# Patient Record
Sex: Male | Born: 1950 | Race: White | Hispanic: No | Marital: Married | Smoking: Former smoker
Health system: Southern US, Community
[De-identification: ages and names within clinical notes are randomized; demographics above are authoritative.]

## PROBLEM LIST (undated history)

## (undated) DIAGNOSIS — R519 Headache, unspecified: Secondary | ICD-10-CM

## (undated) DIAGNOSIS — J449 Chronic obstructive pulmonary disease, unspecified: Secondary | ICD-10-CM

## (undated) DIAGNOSIS — M1990 Unspecified osteoarthritis, unspecified site: Secondary | ICD-10-CM

## (undated) DIAGNOSIS — I10 Essential (primary) hypertension: Secondary | ICD-10-CM

## (undated) DIAGNOSIS — I8290 Acute embolism and thrombosis of unspecified vein: Secondary | ICD-10-CM

## (undated) DIAGNOSIS — G4730 Sleep apnea, unspecified: Secondary | ICD-10-CM

## (undated) DIAGNOSIS — E119 Type 2 diabetes mellitus without complications: Secondary | ICD-10-CM

## (undated) HISTORY — PX: HAND SURGERY: SHX662

## (undated) HISTORY — PX: SHOULDER ARTHROSCOPY W/ ROTATOR CUFF REPAIR: SHX2400

## (undated) HISTORY — PX: HERNIA REPAIR: SHX51

## (undated) HISTORY — PX: TOTAL SHOULDER ARTHROPLASTY: SHX126

---

## 2004-09-24 ENCOUNTER — Ambulatory Visit: Admission: RE | Admit: 2004-09-24 | Discharge: 2004-09-24 | Payer: Self-pay | Admitting: Orthopedic Surgery

## 2005-02-07 ENCOUNTER — Ambulatory Visit (HOSPITAL_COMMUNITY): Admission: RE | Admit: 2005-02-07 | Discharge: 2005-02-08 | Payer: Self-pay | Admitting: Orthopedic Surgery

## 2006-01-12 ENCOUNTER — Ambulatory Visit (HOSPITAL_COMMUNITY): Admission: RE | Admit: 2006-01-12 | Discharge: 2006-01-12 | Payer: Self-pay | Admitting: Orthopedic Surgery

## 2020-05-21 ENCOUNTER — Other Ambulatory Visit: Payer: Self-pay | Admitting: Orthopaedic Surgery

## 2020-05-21 DIAGNOSIS — Z01818 Encounter for other preprocedural examination: Secondary | ICD-10-CM

## 2020-06-02 NOTE — Patient Instructions (Signed)
DUE TO COVID-19 ONLY ONE VISITOR IS ALLOWED TO COME WITH YOU AND STAY IN THE WAITING ROOM ONLY DURING PRE OP AND PROCEDURE DAY OF SURGERY. THE 1 VISITOR  MAY VISIT WITH YOU AFTER SURGERY IN YOUR PRIVATE ROOM DURING VISITING HOURS ONLY!  YOU NEED TO HAVE A COVID 19 TEST ON: 06/12/20 @          , THIS TEST MUST BE DONE BEFORE SURGERY,  COVID TESTING SITE [address] [place] [address], IT IS ON THE RIGHT GOING OUT [address] APPROXIMATELY  2 MINUTES PAST [employer] ON THE RIGHT. ONCE YOUR COVID TEST IS COMPLETED,  PLEASE BEGIN THE QUARANTINE INSTRUCTIONS AS OUTLINED IN YOUR HANDOUT.                Richie Bonanno    Your procedure is scheduled on: 06/16/20   Report to [facility] Main  Entrance   Report to short stay at: 5:15 AM     Call this number if you have problems the morning of surgery [phone]    Remember:  NO SOLID FOOD AFTER MIDNIGHT THE NIGHT PRIOR TO SURGERY. NOTHING BY MOUTH EXCEPT CLEAR LIQUIDS UNTIL: 4:30 AM . PLEASE FINISH ENSURE DRINK PER SURGEON ORDER  WHICH NEEDS TO BE COMPLETED AT: 4:30 AM .  CLEAR LIQUID DIET  Foods Allowed                                                                     Foods Excluded  Coffee and tea, regular and decaf                             liquids that you cannot  Plain Jell-O any favor except red or purple                                           see through such as: Fruit ices (not with fruit pulp)                                     milk, soups, orange juice  Iced Popsicles                                    All solid food Carbonated beverages, regular and diet                                    Cranberry, grape and apple juices Sports drinks like Gatorade Lightly seasoned clear broth or consume(fat free) Sugar, honey syrup  Sample Menu Breakfast                                Lunch  Supper Cranberry juice                    Beef broth                             Chicken broth Jell-O                                     Grape juice                           Apple juice Coffee or tea                        Jell-O                                      Popsicle                                                Coffee or tea                        Coffee or tea  _____________________________________________________________________   BRUSH YOUR TEETH MORNING OF SURGERY AND RINSE YOUR MOUTH OUT, NO CHEWING GUM CANDY OR MINTS.    Take these medicines the morning of surgery with A SIP OF WATER: gabapentin.                               You may not have any metal on your body including hair pins and              piercings  Do not wear jewelry, lotions, powders or perfumes, deodorant             Men may shave face and neck.   Do not bring valuables to the hospital. [facility] IS NOT             RESPONSIBLE   FOR VALUABLES.  Contacts, dentures or bridgework may not be worn into surgery.  Leave suitcase in the car. After surgery it may be brought to your room.     Patients discharged the day of surgery will not be allowed to drive home. IF YOU ARE HAVING SURGERY AND GOING HOME THE SAME DAY, YOU MUST HAVE AN ADULT TO DRIVE YOU HOME AND BE WITH YOU FOR 24 HOURS. YOU MAY GO HOME BY TAXI OR UBER OR ORTHERWISE, BUT AN ADULT MUST ACCOMPANY YOU HOME AND STAY WITH YOU FOR 24 HOURS.  Name and phone number of your driver:  Special Instructions: N/A              Please read over the following fact sheets you were given: _____________________________________________________________________         [facility] - Preparing for Surgery Before surgery, you can play an important role.  Because skin is not sterile, your skin needs to be as free of germs as possible.  You can reduce the number of germs on your skin by washing with CHG (chlorahexidine gluconate)  soap before surgery.  CHG is an antiseptic cleaner which kills germs and bonds with the skin to continue killing germs  even after washing. Please DO NOT use if you have an allergy to CHG or antibacterial soaps.  If your skin becomes reddened/irritated stop using the CHG and inform your nurse when you arrive at Short Stay. Do not shave (including legs and underarms) for at least 48 hours prior to the first CHG shower.  You may shave your face/neck. Please follow these instructions carefully:  1.  Shower with CHG Soap the night before surgery and the  morning of Surgery.  2.  If you choose to wash your hair, wash your hair first as usual with your  normal  shampoo.  3.  After you shampoo, rinse your hair and body thoroughly to remove the  shampoo.                           4.  Use CHG as you would any other liquid soap.  You can apply chg directly  to the skin and wash                       Gently with a scrungie or clean washcloth.  5.  Apply the CHG Soap to your body ONLY FROM THE NECK DOWN.   Do not use on face/ open                           Wound or open sores. Avoid contact with eyes, ears mouth and genitals (private parts).                       Wash face,  Genitals (private parts) with your normal soap.             6.  Wash thoroughly, paying special attention to the area where your surgery  will be performed.  7.  Thoroughly rinse your body with warm water from the neck down.  8.  DO NOT shower/wash with your normal soap after using and rinsing off  the CHG Soap.                9.  Pat yourself dry with a clean towel.            10.  Wear clean pajamas.            11.  Place clean sheets on your bed the night of your first shower and do not  sleep with pets. Day of Surgery : Do not apply any lotions/deodorants the morning of surgery.  Please wear clean clothes to the hospital/surgery center.  FAILURE TO FOLLOW THESE INSTRUCTIONS MAY RESULT IN THE CANCELLATION OF YOUR SURGERY PATIENT SIGNATURE_________________________________  NURSE  SIGNATURE__________________________________  ________________________________________________________________________   Rogelia Mire  An incentive spirometer is a tool that can help keep your lungs clear and active. This tool measures how well you are filling your lungs with each breath. Taking long deep breaths may help reverse or decrease the chance of developing breathing (pulmonary) problems (especially infection) following:  A long period of time when you are unable to move or be active. BEFORE THE PROCEDURE   If the spirometer includes an indicator to show your best effort, your nurse or respiratory therapist will set it to a desired goal.  If possible, sit up straight or lean slightly forward.  Try not to slouch.  Hold the incentive spirometer in an upright position. INSTRUCTIONS FOR USE  1. Sit on the edge of your bed if possible, or sit up as far as you can in bed or on a chair. 2. Hold the incentive spirometer in an upright position. 3. Breathe out normally. 4. Place the mouthpiece in your mouth and seal your lips tightly around it. 5. Breathe in slowly and as deeply as possible, raising the piston or the ball toward the top of the column. 6. Hold your breath for 3-5 seconds or for as long as possible. Allow the piston or ball to fall to the bottom of the column. 7. Remove the mouthpiece from your mouth and breathe out normally. 8. Rest for a few seconds and repeat Steps 1 through 7 at least 10 times every 1-2 hours when you are awake. Take your time and take a few normal breaths between deep breaths. 9. The spirometer may include an indicator to show your best effort. Use the indicator as a goal to work toward during each repetition. 10. After each set of 10 deep breaths, practice coughing to be sure your lungs are clear. If you have an incision (the cut made at the time of surgery), support your incision when coughing by placing a pillow or rolled up towels firmly  against it. Once you are able to get out of bed, walk around indoors and cough well. You may stop using the incentive spirometer when instructed by your caregiver.  RISKS AND COMPLICATIONS  Take your time so you do not get dizzy or light-headed.  If you are in pain, you may need to take or ask for pain medication before doing incentive spirometry. It is harder to take a deep breath if you are having pain. AFTER USE  Rest and breathe slowly and easily.  It can be helpful to keep track of a log of your progress. Your caregiver can provide you with a simple table to help with this. If you are using the spirometer at home, follow these instructions: [place] IF:   You are having difficultly using the spirometer.  You have trouble using the spirometer as often as instructed.  Your pain medication is not giving enough relief while using the spirometer.  You develop fever of 100.5 F (38.1 C) or higher. SEEK IMMEDIATE MEDICAL CARE IF:   You cough up bloody sputum that had not been present before.  You develop fever of 102 F (38.9 C) or greater.  You develop worsening pain at or near the incision site. MAKE SURE YOU:   Understand these instructions.  Will watch your condition.  Will get help right away if you are not doing well or get worse. Document Released: 05/09/2006 Document Revised: 03/21/2011 Document Reviewed: 07/10/2006 [facility] Patient Information 2014 [place], [place].   ________________________________________________________________________

## 2020-06-03 ENCOUNTER — Encounter (HOSPITAL_COMMUNITY)
Admission: RE | Admit: 2020-06-03 | Discharge: 2020-06-03 | Disposition: A | Discharge status: Home / Self Care | Payer: Self-pay | Source: Ambulatory Visit | Attending: Anesthesiology | Admitting: Anesthesiology

## 2020-06-03 ENCOUNTER — Encounter (HOSPITAL_COMMUNITY)
Admission: RE | Admit: 2020-06-03 | Discharge: 2020-06-03 | Disposition: A | Discharge status: Home / Self Care | Payer: Medicare HMO | Source: Ambulatory Visit | Attending: Orthopaedic Surgery | Admitting: Orthopaedic Surgery

## 2020-06-03 ENCOUNTER — Encounter (HOSPITAL_COMMUNITY): Payer: Self-pay

## 2020-06-03 ENCOUNTER — Ambulatory Visit (HOSPITAL_COMMUNITY)
Admission: RE | Admit: 2020-06-03 | Discharge: 2020-06-03 | Disposition: A | Discharge status: Home / Self Care | Payer: Medicare HMO | Source: Ambulatory Visit | Attending: Orthopaedic Surgery | Admitting: Orthopaedic Surgery

## 2020-06-03 ENCOUNTER — Other Ambulatory Visit: Payer: Self-pay

## 2020-06-03 DIAGNOSIS — F17210 Nicotine dependence, cigarettes, uncomplicated: Secondary | ICD-10-CM | POA: Insufficient documentation

## 2020-06-03 DIAGNOSIS — E119 Type 2 diabetes mellitus without complications: Secondary | ICD-10-CM | POA: Diagnosis not present

## 2020-06-03 DIAGNOSIS — Z01818 Encounter for other preprocedural examination: Secondary | ICD-10-CM | POA: Diagnosis not present

## 2020-06-03 HISTORY — DX: Unspecified osteoarthritis, unspecified site: M19.90

## 2020-06-03 HISTORY — DX: Type 2 diabetes mellitus without complications: E11.9

## 2020-06-03 LAB — BASIC METABOLIC PANEL
Anion gap: 9 (ref 5–15)
BUN: 13 mg/dL (ref 8–23)
CO2: 22 mmol/L (ref 22–32)
Calcium: 9.2 mg/dL (ref 8.9–10.3)
Chloride: 104 mmol/L (ref 98–111)
Creatinine, Ser: 0.86 mg/dL (ref 0.61–1.24)
GFR, Estimated: >60 mL/min (ref >60)
Glucose, Bld: 228 mg/dL — ABNORMAL HIGH (ref 70–99)
Potassium: 4.4 mmol/L (ref 3.5–5.1)
Sodium: 135 mmol/L (ref 135–145)

## 2020-06-03 LAB — CBC WITH DIFFERENTIAL/PLATELET
Abs Immature Granulocytes: 0.03 10*3/uL (ref 0.00–0.07)
Basophils Absolute: 0 10*3/uL (ref 0.0–0.1)
Basophils Relative: 0 %
Eosinophils Absolute: 0.2 10*3/uL (ref 0.0–0.5)
Eosinophils Relative: 2 %
HCT: 50.7 % (ref 39.0–52.0)
Hemoglobin: 16.7 g/dL (ref 13.0–17.0)
Immature Granulocytes: 0 %
Lymphocytes Relative: 27 %
Lymphs Abs: 2.5 10*3/uL (ref 0.7–4.0)
MCH: 29.9 pg (ref 26.0–34.0)
MCHC: 32.9 g/dL (ref 30.0–36.0)
MCV: 90.7 fL (ref 80.0–100.0)
Monocytes Absolute: 0.8 10*3/uL (ref 0.1–1.0)
Monocytes Relative: 8 %
Neutro Abs: 5.9 10*3/uL (ref 1.7–7.7)
Neutrophils Relative %: 63 %
Platelets: 177 10*3/uL (ref 150–400)
RBC: 5.59 MIL/uL (ref 4.22–5.81)
RDW: 13.6 % (ref 11.5–15.5)
WBC: 9.3 10*3/uL (ref 4.0–10.5)
nRBC: 0 % (ref 0.0–0.2)

## 2020-06-03 LAB — TYPE AND SCREEN
ABO/RH(D): A NEG
Antibody Screen: NEGATIVE

## 2020-06-03 LAB — APTT: aPTT: 33 seconds (ref 24–36)

## 2020-06-03 LAB — PROTIME-INR
INR: 1.3 — ABNORMAL HIGH (ref 0.8–1.2)
Prothrombin Time: 16.5 seconds — ABNORMAL HIGH (ref 11.4–15.2)

## 2020-06-03 NOTE — Progress Notes (Signed)
Please be aware that all pt's orders disappear when the pt. came today for PAT and labs.Aparently the chart was merge and after that, the surgical orders and med reconciliation  disappear.RN was able to place labs orders,chest XR,EKG back in the system,but the other orders are not there.

## 2020-06-03 NOTE — Patient Instructions (Addendum)
DUE TO COVID-19 ONLY ONE VISITOR IS ALLOWED TO COME WITH YOU AND STAY IN THE WAITING ROOM ONLY DURING PRE OP AND PROCEDURE DAY OF SURGERY. THE 1 VISITOR  MAY VISIT WITH YOU AFTER SURGERY IN YOUR PRIVATE ROOM DURING VISITING HOURS ONLY!  YOU NEED TO HAVE A COVID 19 TEST ON: 06/12/20 @ 1:00 PM, THIS TEST MUST BE DONE BEFORE SURGERY,  COVID TESTING SITE [address] [place] [address], IT IS ON THE RIGHT GOING OUT [address] APPROXIMATELY  2 MINUTES PAST [employer] ON THE RIGHT. ONCE YOUR COVID TEST IS COMPLETED,  PLEASE BEGIN THE QUARANTINE INSTRUCTIONS AS OUTLINED IN YOUR HANDOUT.                Craig Bauer   Your procedure is scheduled on: 6/722   Report to [facility] Main  Entrance   Report to short stay at: 5:15 AM     Call this number if you have problems the morning of surgery [phone]    Remember: NO SOLID FOOD AFTER MIDNIGHT THE NIGHT PRIOR TO SURGERY. NOTHING BY MOUTH EXCEPT CLEAR LIQUIDS UNTIL: 4:30 AM . PLEASE FINISH GATORADE DRINK PER SURGEON ORDER  WHICH NEEDS TO BE COMPLETED AT: 4:30 AM .  CLEAR LIQUID DIET  Foods Allowed                                                                     Foods Excluded  Coffee and tea, regular and decaf                             liquids that you cannot  Plain Jell-O any favor except red or purple                                           see through such as: Fruit ices (not with fruit pulp)                                     milk, soups, orange juice  Iced Popsicles                                    All solid food Carbonated beverages, regular and diet                                    Cranberry, grape and apple juices Sports drinks like Gatorade Lightly seasoned clear broth or consume(fat free) Sugar, honey syrup  Sample Menu Breakfast                                Lunch  Supper Cranberry juice                    Beef broth                             Chicken broth Jell-O                                     Grape juice                           Apple juice Coffee or tea                        Jell-O                                      Popsicle                                                Coffee or tea                        Coffee or tea  _____________________________________________________________________  BRUSH YOUR TEETH MORNING OF SURGERY AND RINSE YOUR MOUTH OUT, NO CHEWING GUM CANDY OR MINTS.     Take these medicines the morning of surgery with A SIP OF WATER: gabapentin.  How to Manage Your Diabetes Before and After Surgery  Why is it important to control my blood sugar before and after surgery? . Improving blood sugar levels before and after surgery helps healing and can limit problems. . A way of improving blood sugar control is eating a healthy diet by: o  Eating less sugar and carbohydrates o  Increasing activity/exercise o  Talking with your doctor about reaching your blood sugar goals . High blood sugars (greater than 180 mg/dL) can raise your risk of infections and slow your recovery, so you will need to focus on controlling your diabetes during the weeks before surgery. . Make sure that the doctor who takes care of your diabetes knows about your planned surgery including the date and location.  How do I manage my blood sugar before surgery? . Check your blood sugar at least 4 times a day, starting 2 days before surgery, to make sure that the level is not too high or low. o Check your blood sugar the morning of your surgery when you wake up and every 2 hours until you get to the Short Stay unit. . If your blood sugar is less than 70 mg/dL, you will need to treat for low blood sugar: o Do not take insulin. o Treat a low blood sugar (less than 70 mg/dL) with  cup of clear juice (cranberry or apple), 4 glucose tablets, OR glucose gel. o Recheck blood sugar in 15 minutes after treatment (to make sure it is greater than  70 mg/dL). If your blood sugar is not greater than 70 mg/dL on recheck, call [phone] for further instructions. . Report your blood sugar to the short stay nurse when you get to Short Stay.  . If you are  admitted to the hospital after surgery: o Your blood sugar will be checked by the staff and you will probably be given insulin after surgery (instead of oral diabetes medicines) to make sure you have good blood sugar levels. o The goal for blood sugar control after surgery is 80-180 mg/dL.   WHAT DO I DO ABOUT MY DIABETES MEDICATION?  Marland Kitchen Do not take oral diabetes medicines (pills) the morning of surgery.  . THE DAY BEFORE SURGERY, take your diabetic oral medicines as usual.      . THE MORNING OF SURGERY, DO NOT TAKE ANY DIABETIC ORAL MEDICATIONS DAY OF YOUR SURGERY  . The day of surgery, do not take other diabetes injectables, including Byetta (exenatide), Bydureon (exenatide ER), Victoza (liraglutide), or Trulicity (dulaglutide).                            You may not have any metal on your body including hair pins and              piercings  Do not wear jewelry,lotions, powders or perfumes, deodorant             Men may shave face and neck.   Do not bring valuables to the hospital. [facility] IS NOT             RESPONSIBLE   FOR VALUABLES.  Contacts, dentures or bridgework may not be worn into surgery.  Leave suitcase in the car. After surgery it may be brought to your room.     Patients discharged the day of surgery will not be allowed to drive home. IF YOU ARE HAVING SURGERY AND GOING HOME THE SAME DAY, YOU MUST HAVE AN ADULT TO DRIVE YOU HOME AND BE WITH YOU FOR 24 HOURS. YOU MAY GO HOME BY TAXI OR UBER OR ORTHERWISE, BUT AN ADULT MUST ACCOMPANY YOU HOME AND STAY WITH YOU FOR 24 HOURS.  Name and phone number of your driver:  Special Instructions: N/A              Please read over the following fact sheets you were  given: _____________________________________________________________________          [facility] - Preparing for Surgery Before surgery, you can play an important role.  Because skin is not sterile, your skin needs to be as free of germs as possible.  You can reduce the number of germs on your skin by washing with CHG (chlorahexidine gluconate) soap before surgery.  CHG is an antiseptic cleaner which kills germs and bonds with the skin to continue killing germs even after washing. Please DO NOT use if you have an allergy to CHG or antibacterial soaps.  If your skin becomes reddened/irritated stop using the CHG and inform your nurse when you arrive at Short Stay. Do not shave (including legs and underarms) for at least 48 hours prior to the first CHG shower.  You may shave your face/neck. Please follow these instructions carefully:  1.  Shower with CHG Soap the night before surgery and the  morning of Surgery.  2.  If you choose to wash your hair, wash your hair first as usual with your  normal  shampoo.  3.  After you shampoo, rinse your hair and body thoroughly to remove the  shampoo.                           4.  Use CHG as you would any other liquid soap.  You can apply chg directly  to the skin and wash                       Gently with a scrungie or clean washcloth.  5.  Apply the CHG Soap to your body ONLY FROM THE NECK DOWN.   Do not use on face/ open                           Wound or open sores. Avoid contact with eyes, ears mouth and genitals (private parts).                       Wash face,  Genitals (private parts) with your normal soap.             6.  Wash thoroughly, paying special attention to the area where your surgery  will be performed.  7.  Thoroughly rinse your body with warm water from the neck down.  8.  DO NOT shower/wash with your normal soap after using and rinsing off  the CHG Soap.                9.  Pat yourself dry with a clean towel.            10.  Wear clean  pajamas.            11.  Place clean sheets on your bed the night of your first shower and do not  sleep with pets. Day of Surgery : Do not apply any lotions/deodorants the morning of surgery.  Please wear clean clothes to the hospital/surgery center.  FAILURE TO FOLLOW THESE INSTRUCTIONS MAY RESULT IN THE CANCELLATION OF YOUR SURGERY PATIENT SIGNATURE_________________________________  NURSE SIGNATURE__________________________________  ________________________________________________________________________   Rogelia MireIncentive Spirometer  An incentive spirometer is a tool that can help keep your lungs clear and active. This tool measures how well you are filling your lungs with each breath. Taking long deep breaths may help reverse or decrease the chance of developing breathing (pulmonary) problems (especially infection) following:  A long period of time when you are unable to move or be active. BEFORE THE PROCEDURE   If the spirometer includes an indicator to show your best effort, your nurse or respiratory therapist will set it to a desired goal.  If possible, sit up straight or lean slightly forward. Try not to slouch.  Hold the incentive spirometer in an upright position. INSTRUCTIONS FOR USE  1. Sit on the edge of your bed if possible, or sit up as far as you can in bed or on a chair. 2. Hold the incentive spirometer in an upright position. 3. Breathe out normally. 4. Place the mouthpiece in your mouth and seal your lips tightly around it. 5. Breathe in slowly and as deeply as possible, raising the piston or the ball toward the top of the column. 6. Hold your breath for 3-5 seconds or for as long as possible. Allow the piston or ball to fall to the bottom of the column. 7. Remove the mouthpiece from your mouth and breathe out normally. 8. Rest for a few seconds and repeat Steps 1 through 7 at least 10 times every 1-2 hours when you are awake. Take your time and take a few normal breaths  between deep breaths. 9. The spirometer may include an indicator to show your  best effort. Use the indicator as a goal to work toward during each repetition. 10. After each set of 10 deep breaths, practice coughing to be sure your lungs are clear. If you have an incision (the cut made at the time of surgery), support your incision when coughing by placing a pillow or rolled up towels firmly against it. Once you are able to get out of bed, walk around indoors and cough well. You may stop using the incentive spirometer when instructed by your caregiver.  RISKS AND COMPLICATIONS  Take your time so you do not get dizzy or light-headed.  If you are in pain, you may need to take or ask for pain medication before doing incentive spirometry. It is harder to take a deep breath if you are having pain. AFTER USE  Rest and breathe slowly and easily.  It can be helpful to keep track of a log of your progress. Your caregiver can provide you with a simple table to help with this. If you are using the spirometer at home, follow these instructions: SEEK MEDICAL CARE IF:   You are having difficultly using the spirometer.  You have trouble using the spirometer as often as instructed.  Your pain medication is not giving enough relief while using the spirometer.  You develop fever of 100.5 F (38.1 C) or higher. SEEK IMMEDIATE MEDICAL CARE IF:   You cough up bloody sputum that had not been present before.  You develop fever of 102 F (38.9 C) or greater.  You develop worsening pain at or near the incision site. MAKE SURE YOU:   Understand these instructions.  Will watch your condition.  Will get help right away if you are not doing well or get worse. Document Released: 05/09/2006 Document Revised: 03/21/2011 Document Reviewed: 07/10/2006 [facility] Patient Information 2014 [place], [place].   ________________________________________________________________________

## 2020-06-03 NOTE — Progress Notes (Signed)
COVID Vaccine Completed: Yes Date COVID Vaccine completed: 2021. [place] COVID vaccine manufacturer:    Moderna     PCP - Samuella Cota: [facility] Cardiologist -   Chest x-ray -  EKG -  Stress Test -  ECHO -  Cardiac Cath -  Pacemaker/ICD device last checked:  Sleep Study -  CPAP -   Fasting Blood Sugar - 130's-200's Checks Blood Sugar __4___ times a day  Blood Thinner Instructions: No instructions yet for Xarelto.RN encourage pt. And his wife to contact surgeon's office to get instructions. Aspirin Instructions: Last Dose:  Anesthesia review: Hx: OSA(No CPAP),DIA,smoker  Patient denies shortness of breath, fever, cough and chest pain at PAT appointment   Patient verbalized understanding of instructions that were given to them at the PAT appointment. Patient was also instructed that they will need to review over the PAT instructions again at home before surgery.

## 2020-06-04 LAB — HEMOGLOBIN A1C
Hgb A1c MFr Bld: 12.8 % — ABNORMAL HIGH (ref 4.8–5.6)
Mean Plasma Glucose: 321 mg/dL

## 2020-06-04 NOTE — Progress Notes (Signed)
CBG 223 

## 2020-06-12 ENCOUNTER — Other Ambulatory Visit (HOSPITAL_COMMUNITY): Payer: Medicare HMO

## 2020-06-16 ENCOUNTER — Encounter (HOSPITAL_COMMUNITY): Admission: RE | Payer: Self-pay | Source: Other Acute Inpatient Hospital

## 2020-06-16 ENCOUNTER — Ambulatory Visit: Admit: 2020-06-16 | Payer: Self-pay | Admitting: Orthopaedic Surgery

## 2020-06-16 ENCOUNTER — Ambulatory Visit (HOSPITAL_COMMUNITY)
Admission: RE | Admit: 2020-06-16 | Payer: Medicare HMO | Source: Other Acute Inpatient Hospital | Admitting: Orthopaedic Surgery

## 2020-06-16 SURGERY — ARTHROPLASTY, KNEE, TOTAL
Anesthesia: Spinal | Site: Knee | Laterality: Right

## 2020-08-25 ENCOUNTER — Other Ambulatory Visit: Payer: Self-pay | Admitting: Orthopaedic Surgery

## 2020-08-25 DIAGNOSIS — Z01818 Encounter for other preprocedural examination: Secondary | ICD-10-CM

## 2020-09-15 ENCOUNTER — Other Ambulatory Visit: Payer: Self-pay | Admitting: Orthopaedic Surgery

## 2020-09-15 DIAGNOSIS — Z01818 Encounter for other preprocedural examination: Secondary | ICD-10-CM

## 2020-09-21 NOTE — Patient Instructions (Addendum)
DUE TO COVID-19 ONLY ONE VISITOR IS ALLOWED TO COME WITH YOU AND STAY IN THE WAITING ROOM ONLY DURING PRE OP AND PROCEDURE.   **NO VISITORS ARE ALLOWED IN THE SHORT STAY AREA OR RECOVERY ROOM!!**       Your procedure is scheduled on: 09/29/20   Report to [facility] Main  Entrance    Report to admitting at 9:35 AM   Call this number if you have problems the morning of surgery [phone]   Do not eat food :After Midnight.   May have liquids until 9:20 AM day of surgery  CLEAR LIQUID DIET  Foods Allowed                                                                     Foods Excluded  Water, Black Coffee and tea (no milk or creamer)           liquids that you cannot  Plain Jell-O in any flavor  (No red)                                    see through such as: Fruit ices (not with fruit pulp)                                            milk, soups, orange juice              Iced Popsicles (No red)                                                All solid food                                   Apple juices Sports drinks like Gatorade (No red) Lightly seasoned clear broth or consume(fat free) Sugar     The day of surgery:  Drink ONE (1) Pre-Surgery G2 by 9:20 am the morning of surgery. Drink in one sitting. Do not sip.  This drink was given to you during your hospital  pre-op appointment visit. Nothing else to drink after completing the  Pre-Surgery G2.          If you have questions, please contact your surgeon's office.     Oral Hygiene is also important to reduce your risk of infection.                                    Remember - BRUSH YOUR TEETH THE MORNING OF SURGERY WITH YOUR REGULAR TOOTHPASTE   Take these medicines the morning of surgery with A SIP OF WATER: Duloxetine, Gabapentin, Omeprazole, Oxycodone, Flomax, Topamax.   DO NOT TAKE ANY ORAL DIABETIC MEDICATIONS DAY OF YOUR SURGERY  How to Manage Your Diabetes Before and After Surgery  Why is  it  important to control my blood sugar before and after surgery? Improving blood sugar levels before and after surgery helps healing and can limit problems. A way of improving blood sugar control is eating a healthy diet by:  Eating less sugar and carbohydrates  Increasing activity/exercise  Talking with your doctor about reaching your blood sugar goals High blood sugars (greater than 180 mg/dL) can raise your risk of infections and slow your recovery, so you will need to focus on controlling your diabetes during the weeks before surgery. Make sure that the doctor who takes care of your diabetes knows about your planned surgery including the date and location.  How do I manage my blood sugar before surgery? Check your blood sugar at least 4 times a day, starting 2 days before surgery, to make sure that the level is not too high or low. Check your blood sugar the morning of your surgery when you wake up and every 2 hours until you get to the Short Stay unit. If your blood sugar is less than 70 mg/dL, you will need to treat for low blood sugar: Do not take insulin. Treat a low blood sugar (less than 70 mg/dL) with  cup of clear juice (cranberry or apple), 4 glucose tablets, OR glucose gel. Recheck blood sugar in 15 minutes after treatment (to make sure it is greater than 70 mg/dL). If your blood sugar is not greater than 70 mg/dL on recheck, call [phone] for further instructions. Report your blood sugar to the short stay nurse when you get to Short Stay.  If you are admitted to the hospital after surgery: Your blood sugar will be checked by the staff and you will probably be given insulin after surgery (instead of oral diabetes medicines) to make sure you have good blood sugar levels. The goal for blood sugar control after surgery is 80-180 mg/dL.   WHAT DO I DO ABOUT MY DIABETES MEDICATION?  Do not take oral diabetes medicines (pills) the morning of surgery.  THE DAY BEFORE SURGERY, take  Metformin and Januvia as prescribed. Take 50% of Insulin glargine dose.       THE MORNING OF SURGERY, do not take Metformin, Januvia, or Insulin glargine.  Reviewed and Endorsed by [facility] Patient Education Committee, August 2015                               You may not have any metal on your body including jewelry, and body piercing             Do not wear lotions, powders, cologne, or deodorant              Men may shave face and neck.   Do not bring valuables to the hospital. [facility] IS NOT             RESPONSIBLE   FOR VALUABLES.   Contacts, dentures or bridgework may not be worn into surgery.    Patients discharged the day of surgery will not be allowed to drive home.  Special Instructions: Bring a copy of your healthcare power of attorney and living will documents         the day of surgery if you haven't scanned them in before.   Please read over the following fact sheets you were given: IF YOU HAVE QUESTIONS ABOUT YOUR PRE OP INSTRUCTIONS PLEASE CALL [phone]-Taevon Aschoff   [place] - Preparing for Surgery  Before surgery, you can play an important role.  Because skin is not sterile, your skin needs to be as free of germs as possible.  You can reduce the number of germs on your skin by washing with CHG (chlorahexidine gluconate) soap before surgery.  CHG is an antiseptic cleaner which kills germs and bonds with the skin to continue killing germs even after washing. Please DO NOT use if you have an allergy to CHG or antibacterial soaps.  If your skin becomes reddened/irritated stop using the CHG and inform your nurse when you arrive at Short Stay. Do not shave (including legs and underarms) for at least 48 hours prior to the first CHG shower.  You may shave your face/neck.  Please follow these instructions carefully:  1.  Shower with CHG Soap the night before surgery and the  morning of surgery.  2.  If you choose to wash your hair, wash your hair first as usual with  your normal  shampoo.  3.  After you shampoo, rinse your hair and body thoroughly to remove the shampoo.                             4.  Use CHG as you would any other liquid soap.  You can apply chg directly to the skin and wash.  Gently with a scrungie or clean washcloth.  5.  Apply the CHG Soap to your body ONLY FROM THE NECK DOWN.   Do   not use on face/ open                           Wound or open sores. Avoid contact with eyes, ears mouth and   genitals (private parts).                       Wash face,  Genitals (private parts) with your normal soap.             6.  Wash thoroughly, paying special attention to the area where your    surgery  will be performed.  7.  Thoroughly rinse your body with warm water from the neck down.  8.  DO NOT shower/wash with your normal soap after using and rinsing off the CHG Soap.                9.  Pat yourself dry with a clean towel.            10.  Wear clean pajamas.            11.  Place clean sheets on your bed the night of your first shower and do not  sleep with pets. Day of Surgery : Do not apply any lotions/deodorants the morning of surgery.  Please wear clean clothes to the hospital/surgery center.  FAILURE TO FOLLOW THESE INSTRUCTIONS MAY RESULT IN THE CANCELLATION OF YOUR SURGERY  PATIENT SIGNATURE_________________________________  NURSE SIGNATURE__________________________________  ________________________________________________________________________   Rogelia Mire  An incentive spirometer is a tool that can help keep your lungs clear and active. This tool measures how well you are filling your lungs with each breath. Taking long deep breaths may help reverse or decrease the chance of developing breathing (pulmonary) problems (especially infection) following: A long period of time when you are unable to move or be active. BEFORE THE PROCEDURE  If the spirometer includes an indicator to show  your best effort, your nurse or  respiratory therapist will set it to a desired goal. If possible, sit up straight or lean slightly forward. Try not to slouch. Hold the incentive spirometer in an upright position. INSTRUCTIONS FOR USE  Sit on the edge of your bed if possible, or sit up as far as you can in bed or on a chair. Hold the incentive spirometer in an upright position. Breathe out normally. Place the mouthpiece in your mouth and seal your lips tightly around it. Breathe in slowly and as deeply as possible, raising the piston or the ball toward the top of the column. Hold your breath for 3-5 seconds or for as long as possible. Allow the piston or ball to fall to the bottom of the column. Remove the mouthpiece from your mouth and breathe out normally. Rest for a few seconds and repeat Steps 1 through 7 at least 10 times every 1-2 hours when you are awake. Take your time and take a few normal breaths between deep breaths. The spirometer may include an indicator to show your best effort. Use the indicator as a goal to work toward during each repetition. After each set of 10 deep breaths, practice coughing to be sure your lungs are clear. If you have an incision (the cut made at the time of surgery), support your incision when coughing by placing a pillow or rolled up towels firmly against it. Once you are able to get out of bed, walk around indoors and cough well. You may stop using the incentive spirometer when instructed by your caregiver.  RISKS AND COMPLICATIONS Take your time so you do not get dizzy or light-headed. If you are in pain, you may need to take or ask for pain medication before doing incentive spirometry. It is harder to take a deep breath if you are having pain. AFTER USE Rest and breathe slowly and easily. It can be helpful to keep track of a log of your progress. Your caregiver can provide you with a simple table to help with this. If you are using the spirometer at home, follow these  instructions: SEEK MEDICAL CARE IF:  You are having difficultly using the spirometer. You have trouble using the spirometer as often as instructed. Your pain medication is not giving enough relief while using the spirometer. You develop fever of 100.5 F (38.1 C) or higher. SEEK IMMEDIATE MEDICAL CARE IF:  You cough up bloody sputum that had not been present before. You develop fever of 102 F (38.9 C) or greater. You develop worsening pain at or near the incision site. MAKE SURE YOU:  Understand these instructions. Will watch your condition. Will get help right away if you are not doing well or get worse. Document Released: 05/09/2006 Document Revised: 03/21/2011 Document Reviewed: 07/10/2006 Exit[employer] Patient Information 2014 Exit[employer], [place].   ________________________________________________________________________  WHAT IS A BLOOD TRANSFUSION? Blood Transfusion Information  A transfusion is the replacement of blood or some of its parts. Blood is made up of multiple cells which provide different functions. Red blood cells carry oxygen and are used for blood loss replacement. White blood cells fight against infection. Platelets control bleeding. Plasma helps clot blood. Other blood products are available for specialized needs, such as hemophilia or other clotting disorders. BEFORE THE TRANSFUSION  Who gives blood for transfusions?  Healthy volunteers who are fully evaluated to make sure their blood is safe. This is blood bank blood. Transfusion therapy is the safest it has ever been in  the practice of medicine. Before blood is taken from a donor, a complete history is taken to make sure that person has no history of diseases nor engages in risky social behavior (examples are intravenous drug use or sexual activity with multiple partners). The donor's travel history is screened to minimize risk of transmitting infections, such as malaria. The donated blood is tested for signs of  infectious diseases, such as HIV and hepatitis. The blood is then tested to be sure it is compatible with you in order to minimize the chance of a transfusion reaction. If you or a relative donates blood, this is often done in anticipation of surgery and is not appropriate for emergency situations. It takes many days to process the donated blood. RISKS AND COMPLICATIONS Although transfusion therapy is very safe and saves many lives, the main dangers of transfusion include:  Getting an infectious disease. Developing a transfusion reaction. This is an allergic reaction to something in the blood you were given. Every precaution is taken to prevent this. The decision to have a blood transfusion has been considered carefully by your caregiver before blood is given. Blood is not given unless the benefits outweigh the risks. AFTER THE TRANSFUSION Right after receiving a blood transfusion, you will usually feel much better and more energetic. This is especially true if your red blood cells have gotten low (anemic). The transfusion raises the level of the red blood cells which carry oxygen, and this usually causes an energy increase. The nurse administering the transfusion will monitor you carefully for complications. HOME CARE INSTRUCTIONS  No special instructions are needed after a transfusion. You may find your energy is better. Speak with your caregiver about any limitations on activity for underlying diseases you may have. SEEK MEDICAL CARE IF:  Your condition is not improving after your transfusion. You develop redness or irritation at the intravenous (IV) site. SEEK IMMEDIATE MEDICAL CARE IF:  Any of the following symptoms occur over the next 12 hours: Shaking chills. You have a temperature by mouth above 102 F (38.9 C), not controlled by medicine. Chest, back, or muscle pain. People around you feel you are not acting correctly or are confused. Shortness of breath or difficulty  breathing. Dizziness and fainting. You get a rash or develop hives. You have a decrease in urine output. Your urine turns a dark color or changes to pink, red, or brown. Any of the following symptoms occur over the next 10 days: You have a temperature by mouth above 102 F (38.9 C), not controlled by medicine. Shortness of breath. Weakness after normal activity. The white part of the eye turns yellow (jaundice). You have a decrease in the amount of urine or are urinating less often. Your urine turns a dark color or changes to pink, red, or brown. Document Released: 12/25/1999 Document Revised: 03/21/2011 Document Reviewed: 08/13/2007 [facility] Patient Information 2014 [place], [place].  _______________________________________________________________________

## 2020-09-22 ENCOUNTER — Other Ambulatory Visit: Payer: Self-pay

## 2020-09-22 ENCOUNTER — Encounter (HOSPITAL_COMMUNITY)
Admission: RE | Admit: 2020-09-22 | Discharge: 2020-09-22 | Disposition: A | Discharge status: Home / Self Care | Payer: Medicare HMO | Source: Ambulatory Visit | Attending: Orthopaedic Surgery | Admitting: Orthopaedic Surgery

## 2020-09-22 ENCOUNTER — Encounter (HOSPITAL_COMMUNITY): Payer: Self-pay

## 2020-09-22 ENCOUNTER — Ambulatory Visit (HOSPITAL_COMMUNITY)
Admission: RE | Admit: 2020-09-22 | Discharge: 2020-09-22 | Disposition: A | Discharge status: Home / Self Care | Payer: Medicare HMO | Source: Ambulatory Visit | Attending: Orthopaedic Surgery | Admitting: Orthopaedic Surgery

## 2020-09-22 DIAGNOSIS — Z87891 Personal history of nicotine dependence: Secondary | ICD-10-CM | POA: Diagnosis not present

## 2020-09-22 DIAGNOSIS — Z01818 Encounter for other preprocedural examination: Secondary | ICD-10-CM | POA: Insufficient documentation

## 2020-09-22 DIAGNOSIS — G4730 Sleep apnea, unspecified: Secondary | ICD-10-CM | POA: Insufficient documentation

## 2020-09-22 DIAGNOSIS — M1711 Unilateral primary osteoarthritis, right knee: Secondary | ICD-10-CM | POA: Insufficient documentation

## 2020-09-22 DIAGNOSIS — E119 Type 2 diabetes mellitus without complications: Secondary | ICD-10-CM | POA: Diagnosis not present

## 2020-09-22 DIAGNOSIS — Z79899 Other long term (current) drug therapy: Secondary | ICD-10-CM | POA: Insufficient documentation

## 2020-09-22 DIAGNOSIS — Z7901 Long term (current) use of anticoagulants: Secondary | ICD-10-CM | POA: Diagnosis not present

## 2020-09-22 DIAGNOSIS — Z7984 Long term (current) use of oral hypoglycemic drugs: Secondary | ICD-10-CM | POA: Insufficient documentation

## 2020-09-22 DIAGNOSIS — Z794 Long term (current) use of insulin: Secondary | ICD-10-CM | POA: Insufficient documentation

## 2020-09-22 HISTORY — DX: Sleep apnea, unspecified: G47.30

## 2020-09-22 HISTORY — DX: Acute embolism and thrombosis of unspecified vein: I82.90

## 2020-09-22 HISTORY — DX: Headache, unspecified: R51.9

## 2020-09-22 LAB — URINALYSIS, ROUTINE W REFLEX MICROSCOPIC
Bacteria, UA: NONE SEEN
Bilirubin Urine: NEGATIVE
Glucose, UA: NEGATIVE mg/dL
Hgb urine dipstick: NEGATIVE
Ketones, ur: NEGATIVE mg/dL
Leukocytes,Ua: NEGATIVE
Nitrite: NEGATIVE
Protein, ur: NEGATIVE mg/dL
Specific Gravity, Urine: 1.005 (ref 1.005–1.030)
pH: 6 (ref 5.0–8.0)

## 2020-09-22 LAB — APTT: aPTT: 26 seconds (ref 24–36)

## 2020-09-22 LAB — CBC WITH DIFFERENTIAL/PLATELET
Abs Immature Granulocytes: 0.02 10*3/uL (ref 0.00–0.07)
Basophils Absolute: 0 10*3/uL (ref 0.0–0.1)
Basophils Relative: 1 %
Eosinophils Absolute: 0.2 10*3/uL (ref 0.0–0.5)
Eosinophils Relative: 2 %
HCT: 48 % (ref 39.0–52.0)
Hemoglobin: 15.8 g/dL (ref 13.0–17.0)
Immature Granulocytes: 0 %
Lymphocytes Relative: 24 %
Lymphs Abs: 1.8 10*3/uL (ref 0.7–4.0)
MCH: 30.3 pg (ref 26.0–34.0)
MCHC: 32.9 g/dL (ref 30.0–36.0)
MCV: 92.1 fL (ref 80.0–100.0)
Monocytes Absolute: 0.5 10*3/uL (ref 0.1–1.0)
Monocytes Relative: 7 %
Neutro Abs: 5 10*3/uL (ref 1.7–7.7)
Neutrophils Relative %: 66 %
Platelets: 166 10*3/uL (ref 150–400)
RBC: 5.21 MIL/uL (ref 4.22–5.81)
RDW: 13.2 % (ref 11.5–15.5)
WBC: 7.5 10*3/uL (ref 4.0–10.5)
nRBC: 0 % (ref 0.0–0.2)

## 2020-09-22 LAB — BASIC METABOLIC PANEL
Anion gap: 8 (ref 5–15)
BUN: 12 mg/dL (ref 8–23)
CO2: 27 mmol/L (ref 22–32)
Calcium: 9.3 mg/dL (ref 8.9–10.3)
Chloride: 103 mmol/L (ref 98–111)
Creatinine, Ser: 0.71 mg/dL (ref 0.61–1.24)
GFR, Estimated: >60 mL/min (ref >60)
Glucose, Bld: 199 mg/dL — ABNORMAL HIGH (ref 70–99)
Potassium: 4.3 mmol/L (ref 3.5–5.1)
Sodium: 138 mmol/L (ref 135–145)

## 2020-09-22 LAB — GLUCOSE, CAPILLARY: Glucose-Capillary: 198 mg/dL — ABNORMAL HIGH (ref 70–99)

## 2020-09-22 LAB — SURGICAL PCR SCREEN
MRSA, PCR: POSITIVE — AB
Staphylococcus aureus: POSITIVE — AB

## 2020-09-22 LAB — HEMOGLOBIN A1C
Hgb A1c MFr Bld: 7.6 % — ABNORMAL HIGH (ref 4.8–5.6)
Mean Plasma Glucose: 171.42 mg/dL

## 2020-09-22 LAB — PROTIME-INR
INR: 1 (ref 0.8–1.2)
Prothrombin Time: 12.6 seconds (ref 11.4–15.2)

## 2020-09-22 NOTE — Progress Notes (Signed)
PCR positive for STAPH and MRSA. A1C 7.6. Results sent to Dr. Jerl Santos.

## 2020-09-22 NOTE — Progress Notes (Addendum)
COVID swab appointment: N/a  COVID Vaccine Completed: yes x4 Date COVID Vaccine completed: Has received booster: COVID vaccine manufacturer: Pfizer    [employer]   Date of COVID positive in last 90 days: No  PCP - Carron Curie, PA Cardiologist - can't remember name  Chest x-ray - 09/22/20 Epic EKG - 09/22/20 Epic Stress Test - long time per pt ECHO - long time ago per pt Cardiac Cath - N/a Pacemaker/ICD device last checked: N/a Spinal Cord Stimulator: N/a  Sleep Study - yes positve CPAP - doesn't use it  Fasting Blood Sugar - 100-300 Checks Blood Sugar _1_ times a day  Blood Thinner Instructions: xarelto 3 stop days before surgery Aspirin Instructions: Last Dose:  Activity level: Can go up a flight of stairs and perform activities of daily living without stopping and without symptoms of chest pain or shortness of breath. SOB with over exertion     Anesthesia review: DM, blood clots on Xarelto, A1C 7.6, 1st degree AV block  Patient denies shortness of breath, fever, cough and chest pain at PAT appointment   Patient verbalized understanding of instructions that were given to them at the PAT appointment. Patient was also instructed that they will need to review over the PAT instructions again at home before surgery.

## 2020-09-23 NOTE — Anesthesia Preprocedure Evaluation (Addendum)
Anesthesia Evaluation  Patient identified by MRN, date of birth, ID band Patient awake    Reviewed: Allergy & Precautions, NPO status , Patient's Chart, lab work & pertinent test results  History of Anesthesia Complications Negative for: history of anesthetic complications  Airway Mallampati: II  TM Distance: >3 FB Neck ROM: Full    Dental  (+) Edentulous Lower, Edentulous Upper   Pulmonary sleep apnea , former smoker,    Pulmonary exam normal        Cardiovascular + DVT  Normal cardiovascular exam     Neuro/Psych  Headaches, Seizures -, Well Controlled,  negative psych ROS   GI/Hepatic negative GI ROS, (+)     substance abuse  ,   Endo/Other  diabetes, Type 2, Oral Hypoglycemic AgentsMorbid obesity  Renal/GU negative Renal ROS     Musculoskeletal  (+) Arthritis , narcotic dependent  Abdominal   Peds  Hematology  Plt 166k On xarelto, last dose 5 days ago     Anesthesia Other Findings Surgery previously cancelled for poorly controlled DM, now improved per A1C - see PAT note   Reproductive/Obstetrics                           Anesthesia Physical Anesthesia Plan  ASA: 3  Anesthesia Plan: Spinal   Post-op Pain Management:  Regional for Post-op pain   Induction:   PONV Risk Score and Plan: 1 and Treatment may vary due to age or medical condition and Propofol infusion  Airway Management Planned: Natural Airway and Simple Face Mask  Additional Equipment: None  Intra-op Plan:   Post-operative Plan:   Informed Consent: I have reviewed the patients History and Physical, chart, labs and discussed the procedure including the risks, benefits and alternatives for the proposed anesthesia with the patient or authorized representative who has indicated his/her understanding and acceptance.       Plan Discussed with: CRNA and Anesthesiologist  Anesthesia Plan Comments: (Labs  reviewed, platelets acceptable. Discussed risks and benefits of spinal, including spinal/epidural hematoma, infection, failed block, and PDPH. Patient expressed understanding and wished to proceed. )      Anesthesia Quick Evaluation

## 2020-09-23 NOTE — Progress Notes (Addendum)
Anesthesia Chart Review   Case: [id number] Date/Time: 09/29/20 1203   Procedure: RIGHT TOTAL KNEE ARTHROPLASTY (Right: Knee)   Anesthesia type: Spinal   Pre-op diagnosis: RIGHT KNEE DEGENERATIVE JOINT DISEASE   Location: [facility] ROOM 06 / [facility]   Surgeons: Marcene Corning, MD       DISCUSSION:70 y.o. former smoker with h/o DM II, sleep apnea, right knee djd scheduled for above procedure 09/29/20 with Dr. Marcene Corning.   Surgery previously cancelled due to poorly controlled diabetes.  A1C at PAT visit 06/03/2020 12.8. Pt has been working closely with PCP for better DM controlled.  A1C is now 7.6.  Evaluate CBG DOS.   Pt advised to hold Xarelto 3 days prior to surgery.    Anticipate pt can proceed with planned procedure barring acute status change.   VS: BP (!) 179/79   Pulse 64   Temp 37 C (Oral)   Resp 14   Ht 5' 7.5" (1.715 m)   SpO2 99%   BMI 37.03 kg/m   PROVIDERS: Courtney Heys, PA-C is PCP    LABS: Labs reviewed: Acceptable for surgery. (all labs ordered are listed, but only abnormal results are displayed)  Labs Reviewed  SURGICAL PCR SCREEN - Abnormal; Notable for the following components:      Result Value   MRSA, PCR POSITIVE (*)    Staphylococcus aureus POSITIVE (*)    All other components within normal limits  BASIC METABOLIC PANEL - Abnormal; Notable for the following components:   Glucose, Bld 199 (*)    All other components within normal limits  URINALYSIS, ROUTINE W REFLEX MICROSCOPIC - Abnormal; Notable for the following components:   Color, Urine STRAW (*)    All other components within normal limits  HEMOGLOBIN A1C - Abnormal; Notable for the following components:   Hgb A1c MFr Bld 7.6 (*)    All other components within normal limits  GLUCOSE, CAPILLARY - Abnormal; Notable for the following components:   Glucose-Capillary 198 (*)    All other components within normal limits  CBC WITH DIFFERENTIAL/PLATELET  PROTIME-INR  APTT  TYPE AND SCREEN      IMAGES:   EKG: 09/22/2020 Rate 63 bpm  Sinus rhythm with 1st degree A-V block Left anterior fascicular block Minimal voltage criteria for LVH, may be normal variant ( Cornell product ) Cannot rule out Anterior infarct , age undetermined Abnormal ECG No old tracing to compare  CV: Echo 06/22/2018 SUMMARY  Normal LV systolic function.  Mild LVH.  Diastolic dysfunction, stage 1.  No significant valvular disease.  No evidence of valvular vegetation.  Past Medical History:  Diagnosis Date   Arthritis    Blood clot in vein    Diabetes mellitus without complication (HCC)    Headache    migraine   Sleep apnea     Past Surgical History:  Procedure Laterality Date   HAND SURGERY Left    HERNIA REPAIR     SHOULDER ARTHROSCOPY W/ ROTATOR CUFF REPAIR Bilateral    TOTAL SHOULDER ARTHROPLASTY Bilateral     MEDICATIONS:  bisacodyl (DULCOLAX) 5 MG EC tablet   cyclobenzaprine (FLEXERIL) 10 MG tablet   cyclobenzaprine (FLEXERIL) 10 MG tablet   diclofenac Sodium (VOLTAREN) 1 % GEL   DULoxetine (CYMBALTA) 60 MG capsule   DULoxetine (CYMBALTA) 60 MG capsule   furosemide (LASIX) 40 MG tablet   furosemide (LASIX) 40 MG tablet   gabapentin (NEURONTIN) 800 MG tablet   gabapentin (NEURONTIN) 800 MG tablet  insulin glargine (LANTUS SOLOSTAR) 100 UNIT/ML Solostar Pen   Lidocaine 2 % GEL   meloxicam (MOBIC) 15 MG tablet   metFORMIN (GLUCOPHAGE) 500 MG tablet   metFORMIN (GLUCOPHAGE) 500 MG tablet   mirtazapine (REMERON) 30 MG tablet   mirtazapine (REMERON) 30 MG tablet   naproxen sodium (ALEVE) 220 MG tablet   omeprazole (PRILOSEC) 40 MG capsule   omeprazole (PRILOSEC) 40 MG capsule   oxyCODONE ER (XTAMPZA ER) 36 MG C12A   Oxycodone HCl 20 MG TABS   Oxycodone HCl 20 MG TABS   OZEMPIC, 0.25 OR 0.5 MG/DOSE, 2 MG/1.5ML SOPN   potassium chloride SA (KLOR-CON) 20 MEQ tablet   potassium chloride SA (KLOR-CON) 20 MEQ tablet   rivaroxaban (XARELTO) 20 MG TABS tablet   rOPINIRole  (REQUIP) 1 MG tablet   rOPINIRole (REQUIP) 1 MG tablet   Semaglutide,0.25 or 0.5MG /DOS, (OZEMPIC, 0.25 OR 0.5 MG/DOSE,) 2 MG/1.5ML SOPN   sitaGLIPtin (JANUVIA) 100 MG tablet   SUMAtriptan (IMITREX) 100 MG tablet   tamsulosin (FLOMAX) 0.4 MG CAPS capsule   tamsulosin (FLOMAX) 0.4 MG CAPS capsule   topiramate (TOPAMAX) 200 MG tablet   XARELTO 20 MG TABS tablet   XTAMPZA ER 36 MG C12A   No current facility-administered medications for this encounter.    Jodell Cipro Ward, PA-C [facility] Pre-Surgical Testing [phone]

## 2020-09-28 MED ORDER — TRANEXAMIC ACID 1000 MG/10ML IV SOLN
2000.0000 mg | INTRAVENOUS | Status: DC
  Filled 2020-09-28: qty 20

## 2020-09-28 MED ORDER — VANCOMYCIN HCL 1500 MG/300ML IV SOLN
1500.0000 mg | INTRAVENOUS | Status: AC
  Administered 2020-09-29: 1500 mg via INTRAVENOUS
  Filled 2020-09-28: qty 300

## 2020-09-28 NOTE — H&P (Signed)
TOTAL KNEE ADMISSION H&P  Patient is being admitted for right total knee arthroplasty.  Subjective:  Chief Complaint:right knee pain.  HPI: Craig Bauer, 70 y.o. male, has a history of pain and functional disability in the right knee due to arthritis and has failed non-surgical conservative treatments for greater than 12 weeks to includeNSAID's and/or analgesics, corticosteriod injections, flexibility and strengthening excercises, supervised PT with diminished ADL's post treatment, use of assistive devices, weight reduction as appropriate, and activity modification.  Onset of symptoms was gradual, starting 5 years ago with gradually worsening course since that time. The patient noted no past surgery on the right knee(s).  Patient currently rates pain in the right knee(s) at 10 out of 10 with activity. Patient has night pain, worsening of pain with activity and weight bearing, pain that interferes with activities of daily living, crepitus, and joint swelling.  Patient has evidence of subchondral cysts, subchondral sclerosis, periarticular osteophytes, and joint space narrowing by imaging studies. There is no active infection.  There are no problems to display for this patient.  Past Medical History:  Diagnosis Date  . Arthritis   . Blood clot in vein   . Diabetes mellitus without complication (HCC)   . Headache    migraine  . Sleep apnea     Past Surgical History:  Procedure Laterality Date  . HAND SURGERY Left   . HERNIA REPAIR    . SHOULDER ARTHROSCOPY W/ ROTATOR CUFF REPAIR Bilateral   . TOTAL SHOULDER ARTHROPLASTY Bilateral     Current Facility-Administered Medications  Medication Dose Route Frequency Provider Last Rate Last Admin  . [START ON 09/29/2020] tranexamic acid (CYKLOKAPRON) 2,000 mg in sodium chloride 0.9 % 50 mL Topical Application  2,000 mg Topical To OR Marcene Corning, MD      . Melene Muller ON 09/29/2020] vancomycin (VANCOREADY) IVPB 1500 mg/300 mL  1,500 mg Intravenous On  Call to OR Marcene Corning, MD       Current Outpatient Medications  Medication Sig Dispense Refill Last Dose  . bisacodyl (DULCOLAX) 5 MG EC tablet Take 5 mg by mouth daily as needed for moderate constipation.     . cyclobenzaprine (FLEXERIL) 10 MG tablet Take 10 mg by mouth 2 (two) times daily as needed for muscle spasms.     . diclofenac Sodium (VOLTAREN) 1 % GEL Apply 1 application topically 4 (four) times daily as needed (pain).     . DULoxetine (CYMBALTA) 60 MG capsule Take 60 mg by mouth daily.     . furosemide (LASIX) 40 MG tablet Take 80 mg by mouth 2 (two) times daily.     Marland Kitchen gabapentin (NEURONTIN) 800 MG tablet Take 800 mg by mouth 4 (four) times daily.     . insulin glargine (LANTUS SOLOSTAR) 100 UNIT/ML Solostar Pen Inject 24 Units into the skin at bedtime.     . Lidocaine 2 % GEL Apply 1 application topically daily as needed (pain).     . meloxicam (MOBIC) 15 MG tablet Take 15 mg by mouth daily.     . metFORMIN (GLUCOPHAGE) 500 MG tablet Take 500 mg by mouth 2 (two) times daily.     . mirtazapine (REMERON) 30 MG tablet Take 30 mg by mouth at bedtime.     . naproxen sodium (ALEVE) 220 MG tablet Take 660-880 mg by mouth daily as needed (pain).     Marland Kitchen omeprazole (PRILOSEC) 40 MG capsule Take 40 mg by mouth daily.     Marland Kitchen oxyCODONE ER (XTAMPZA ER)  36 MG C12A Take 36 mg by mouth every 8 (eight) hours.     . Oxycodone HCl 20 MG TABS Take 20 mg by mouth 4 (four) times daily as needed (pain).     . potassium chloride SA (KLOR-CON) 20 MEQ tablet Take 40 mEq by mouth 3 (three) times daily.     . rivaroxaban (XARELTO) 20 MG TABS tablet Take 20 mg by mouth daily.     Marland Kitchen rOPINIRole (REQUIP) 1 MG tablet Take 3 mg by mouth at bedtime.     . Semaglutide,0.25 or 0.5MG /DOS, (OZEMPIC, 0.25 OR 0.5 MG/DOSE,) 2 MG/1.5ML SOPN Inject 0.5 mg into the skin every Friday.     . sitaGLIPtin (JANUVIA) 100 MG tablet Take 100 mg by mouth daily.     . SUMAtriptan (IMITREX) 100 MG tablet Take 100 mg by mouth every 2  (two) hours as needed for migraine. May repeat in 2 hours if headache persists or recurs.     . tamsulosin (FLOMAX) 0.4 MG CAPS capsule Take 0.4 mg by mouth daily.     Marland Kitchen topiramate (TOPAMAX) 200 MG tablet Take 200 mg by mouth 2 (two) times daily.     . cyclobenzaprine (FLEXERIL) 10 MG tablet Take 10 mg by mouth 2 (two) times daily as needed for pain.     . DULoxetine (CYMBALTA) 60 MG capsule Take 60 mg by mouth daily.     . furosemide (LASIX) 40 MG tablet Take 80 mg by mouth 2 (two) times daily.     Marland Kitchen gabapentin (NEURONTIN) 800 MG tablet Take 800 mg by mouth 4 (four) times daily.     . metFORMIN (GLUCOPHAGE) 500 MG tablet Take 500 mg by mouth 2 (two) times daily.     . mirtazapine (REMERON) 30 MG tablet Take 30 mg by mouth at bedtime.     Marland Kitchen omeprazole (PRILOSEC) 40 MG capsule Take 40 mg by mouth daily.     . Oxycodone HCl 20 MG TABS Take 20 mg by mouth every 4 (four) hours as needed for pain.     Marland Kitchen OZEMPIC, 0.25 OR 0.5 MG/DOSE, 2 MG/1.5ML SOPN Inject 0.3 mLs into the skin once a week.     . potassium chloride SA (KLOR-CON) 20 MEQ tablet Take 40 mEq by mouth 3 (three) times daily.     Marland Kitchen rOPINIRole (REQUIP) 1 MG tablet Take 3 mg by mouth at bedtime.     . tamsulosin (FLOMAX) 0.4 MG CAPS capsule Take 0.4 mg by mouth daily.     Carlena Hurl 20 MG TABS tablet Take 20 mg by mouth daily.     Marland Kitchen XTAMPZA ER 36 MG C12A Take 36 mg by mouth every 8 (eight) hours.      Allergies  Allergen Reactions  . Contrast Media [Iodinated Diagnostic Agents] Anaphylaxis  . Ciprofloxacin Hives  . Latex Hives  . Penicillins Hives    Social History   Tobacco Use  . Smoking status: Former    Types: Cigarettes    Quit date: 09/17/2020    Years since quitting: 0.0  . Smokeless tobacco: Never  Substance Use Topics  . Alcohol use: Not Currently    No family history on file.   Review of Systems  Musculoskeletal:  Positive for arthralgias.       Right knee  All other systems reviewed and are  negative.  Objective:  Physical Exam Constitutional:      Appearance: Normal appearance.  HENT:     Head: Normocephalic and atraumatic.  Nose: Nose normal.     Mouth/Throat:     Pharynx: Oropharynx is clear.  Eyes:     Extraocular Movements: Extraocular movements intact.  Cardiovascular:     Rate and Rhythm: Normal rate and regular rhythm.  Pulmonary:     Effort: Pulmonary effort is normal.  Abdominal:     Palpations: Abdomen is soft.  Musculoskeletal:     Cervical back: Normal range of motion.     Comments: Both knees move about 5-100.  He has varus deformities on both sides with medial joint line pain.  There are no significant scars.  Calves are soft and nontender.  Hip motion is full and straight leg raise is negative.    Skin:    General: Skin is warm and dry.  Neurological:     General: No focal deficit present.     Mental Status: He is alert and oriented to person, place, and time.  Psychiatric:        Mood and Affect: Mood normal.        Behavior: Behavior normal.        Thought Content: Thought content normal.        Judgment: Judgment normal.    Vital signs in last 24 hours:    Labs:   Estimated body mass index is 37.03 kg/m as calculated from the following:   Height as of 09/22/20: 5' 7.5" (1.715 m).   Weight as of 06/03/20: 108.9 kg.   Imaging Review Plain radiographs demonstrate severe degenerative joint disease of the right knee(s). The overall alignment isneutral. The bone quality appears to be good for age and reported activity level.      Assessment/Plan:  End stage primary arthritis, right knee   The patient history, physical examination, clinical judgment of the provider and imaging studies are consistent with end stage degenerative joint disease of the right knee(s) and total knee arthroplasty is deemed medically necessary. The treatment options including medical management, injection therapy arthroscopy and arthroplasty were discussed  at length. The risks and benefits of total knee arthroplasty were presented and reviewed. The risks due to aseptic loosening, infection, stiffness, patella tracking problems, thromboembolic complications and other imponderables were discussed. The patient acknowledged the explanation, agreed to proceed with the plan and consent was signed. Patient is being admitted for inpatient treatment for surgery, pain control, PT, OT, prophylactic antibiotics, VTE prophylaxis, progressive ambulation and ADL's and discharge planning. The patient is planning to be discharged home with home health services  Patient's anticipated LOS is less than 2 midnights, meeting these requirements: - Younger than 14 - Lives within 1 hour of care - Has a competent adult at home to recover with post-op recover - NO history of  - Chronic pain requiring opiods  - Diabetes  - Coronary Artery Disease  - Heart failure  - Heart attack  - Stroke  - DVT/VTE  - Cardiac arrhythmia  - Respiratory Failure/COPD  - Renal failure  - Anemia  - Advanced Liver disease

## 2020-09-29 ENCOUNTER — Encounter (HOSPITAL_COMMUNITY)
Admission: RE | Disposition: A | Discharge status: Home / Self Care | Payer: Self-pay | Source: Home / Self Care | Attending: Orthopaedic Surgery

## 2020-09-29 ENCOUNTER — Ambulatory Visit (HOSPITAL_COMMUNITY): Payer: Medicare HMO | Admitting: Anesthesiology

## 2020-09-29 ENCOUNTER — Ambulatory Visit (HOSPITAL_COMMUNITY): Payer: Medicare HMO | Admitting: Physician Assistant

## 2020-09-29 ENCOUNTER — Other Ambulatory Visit: Payer: Self-pay

## 2020-09-29 ENCOUNTER — Encounter (HOSPITAL_COMMUNITY): Payer: Self-pay | Admitting: Orthopaedic Surgery

## 2020-09-29 ENCOUNTER — Observation Stay (HOSPITAL_COMMUNITY)
Admission: RE | Admit: 2020-09-29 | Discharge: 2020-09-30 | Disposition: A | Discharge status: Home / Self Care | Payer: Medicare HMO | Attending: Orthopaedic Surgery | Admitting: Orthopaedic Surgery

## 2020-09-29 DIAGNOSIS — Z20822 Contact with and (suspected) exposure to covid-19: Secondary | ICD-10-CM | POA: Diagnosis not present

## 2020-09-29 DIAGNOSIS — Z87891 Personal history of nicotine dependence: Secondary | ICD-10-CM | POA: Diagnosis not present

## 2020-09-29 DIAGNOSIS — M1711 Unilateral primary osteoarthritis, right knee: Secondary | ICD-10-CM | POA: Diagnosis present

## 2020-09-29 DIAGNOSIS — Z96612 Presence of left artificial shoulder joint: Secondary | ICD-10-CM | POA: Diagnosis not present

## 2020-09-29 DIAGNOSIS — Z96611 Presence of right artificial shoulder joint: Secondary | ICD-10-CM | POA: Insufficient documentation

## 2020-09-29 DIAGNOSIS — Z7901 Long term (current) use of anticoagulants: Secondary | ICD-10-CM | POA: Insufficient documentation

## 2020-09-29 DIAGNOSIS — Z7984 Long term (current) use of oral hypoglycemic drugs: Secondary | ICD-10-CM | POA: Diagnosis not present

## 2020-09-29 DIAGNOSIS — Z79899 Other long term (current) drug therapy: Secondary | ICD-10-CM | POA: Insufficient documentation

## 2020-09-29 DIAGNOSIS — E119 Type 2 diabetes mellitus without complications: Secondary | ICD-10-CM | POA: Diagnosis not present

## 2020-09-29 DIAGNOSIS — Z794 Long term (current) use of insulin: Secondary | ICD-10-CM | POA: Insufficient documentation

## 2020-09-29 HISTORY — PX: TOTAL KNEE ARTHROPLASTY: SHX125

## 2020-09-29 LAB — TYPE AND SCREEN
ABO/RH(D): A NEG
Antibody Screen: NEGATIVE

## 2020-09-29 LAB — PROTIME-INR
INR: 1 (ref 0.8–1.2)
Prothrombin Time: 12.9 seconds (ref 11.4–15.2)

## 2020-09-29 LAB — APTT: aPTT: 27 seconds (ref 24–36)

## 2020-09-29 LAB — SARS CORONAVIRUS 2 BY RT PCR (HOSPITAL ORDER, PERFORMED IN ~~LOC~~ HOSPITAL LAB): SARS Coronavirus 2: NEGATIVE

## 2020-09-29 LAB — GLUCOSE, CAPILLARY
Glucose-Capillary: 253 mg/dL — ABNORMAL HIGH (ref 70–99)
Glucose-Capillary: 71 mg/dL (ref 70–99)

## 2020-09-29 LAB — ABO/RH: ABO/RH(D): A NEG

## 2020-09-29 SURGERY — ARTHROPLASTY, KNEE, TOTAL
Anesthesia: Spinal | Site: Knee | Laterality: Right

## 2020-09-29 MED ORDER — ROPINIROLE HCL 1 MG PO TABS: 3.0000 mg | ORAL_TABLET | Freq: Every day | ORAL | Status: DC

## 2020-09-29 MED ORDER — LIDOCAINE HCL (PF) 2 % IJ SOLN
INTRAMUSCULAR | Status: AC
  Filled 2020-09-29: qty 5

## 2020-09-29 MED ORDER — OXYCODONE ER 36 MG PO C12A: 36.0000 mg | EXTENDED_RELEASE_CAPSULE | Freq: Three times a day (TID) | ORAL | Status: DC

## 2020-09-29 MED ORDER — ONDANSETRON HCL 4 MG/2ML IJ SOLN
INTRAMUSCULAR | Status: DC | PRN
  Administered 2020-09-29: 4 mg via INTRAVENOUS

## 2020-09-29 MED ORDER — OXYCODONE HCL 5 MG PO TABS: 5.0000 mg | ORAL_TABLET | Freq: Once | ORAL | Status: DC | PRN

## 2020-09-29 MED ORDER — DIPHENHYDRAMINE HCL 12.5 MG/5ML PO ELIX: 12.5000 mg | ORAL_SOLUTION | ORAL | Status: DC | PRN

## 2020-09-29 MED ORDER — PHENYLEPHRINE HCL (PRESSORS) 10 MG/ML IV SOLN
INTRAVENOUS | Status: AC
  Filled 2020-09-29: qty 2

## 2020-09-29 MED ORDER — OXYCODONE HCL 5 MG PO TABS
5.0000 mg | ORAL_TABLET | ORAL | Status: DC | PRN
  Administered 2020-09-30 (×2): 10 mg via ORAL
  Filled 2020-09-29 (×2): qty 2

## 2020-09-29 MED ORDER — MIRTAZAPINE 15 MG PO TABS: 30.0000 mg | ORAL_TABLET | Freq: Every day | ORAL | Status: DC

## 2020-09-29 MED ORDER — STERILE WATER FOR IRRIGATION IR SOLN
Status: DC | PRN
  Administered 2020-09-29: 2000 mL

## 2020-09-29 MED ORDER — SODIUM CHLORIDE (PF) 0.9 % IJ SOLN
INTRAMUSCULAR | Status: AC
  Filled 2020-09-29: qty 30

## 2020-09-29 MED ORDER — MUPIROCIN 2 % EX OINT
1.0000 "application " | TOPICAL_OINTMENT | Freq: Two times a day (BID) | CUTANEOUS | Status: DC
  Administered 2020-09-29 – 2020-09-30 (×2): 1 via NASAL
  Filled 2020-09-29: qty 22

## 2020-09-29 MED ORDER — PHENYLEPHRINE HCL-NACL 20-0.9 MG/250ML-% IV SOLN
INTRAVENOUS | Status: DC | PRN
  Administered 2020-09-29: 25 ug/min via INTRAVENOUS

## 2020-09-29 MED ORDER — DOCUSATE SODIUM 100 MG PO CAPS
100.0000 mg | ORAL_CAPSULE | Freq: Two times a day (BID) | ORAL | Status: DC
  Administered 2020-09-29 – 2020-09-30 (×2): 100 mg via ORAL
  Filled 2020-09-29 (×2): qty 1

## 2020-09-29 MED ORDER — FENTANYL CITRATE PF 50 MCG/ML IJ SOSY
50.0000 ug | PREFILLED_SYRINGE | INTRAMUSCULAR | Status: DC
  Administered 2020-09-29: 50 ug via INTRAVENOUS
  Filled 2020-09-29: qty 2

## 2020-09-29 MED ORDER — ACETAMINOPHEN 325 MG PO TABS: 325.0000 mg | ORAL_TABLET | Freq: Four times a day (QID) | ORAL | Status: DC | PRN

## 2020-09-29 MED ORDER — PROPOFOL 500 MG/50ML IV EMUL
INTRAVENOUS | Status: DC | PRN
  Administered 2020-09-29: 70 ug/kg/min via INTRAVENOUS

## 2020-09-29 MED ORDER — CHLORHEXIDINE GLUCONATE 0.12 % MT SOLN
15.0000 mL | Freq: Once | OROMUCOSAL | Status: AC
  Administered 2020-09-29: 15 mL via OROMUCOSAL

## 2020-09-29 MED ORDER — PHENOL 1.4 % MT LIQD: 1.0000 | OROMUCOSAL | Status: DC | PRN

## 2020-09-29 MED ORDER — ORAL CARE MOUTH RINSE: 15.0000 mL | Freq: Once | OROMUCOSAL | Status: AC

## 2020-09-29 MED ORDER — BUPIVACAINE-EPINEPHRINE (PF) 0.25% -1:200000 IJ SOLN
INTRAMUSCULAR | Status: AC
  Filled 2020-09-29: qty 30

## 2020-09-29 MED ORDER — INSULIN GLARGINE-YFGN 100 UNIT/ML ~~LOC~~ SOLN
24.0000 [IU] | Freq: Every day | SUBCUTANEOUS | Status: DC
  Administered 2020-09-29: 24 [IU] via SUBCUTANEOUS
  Filled 2020-09-29: qty 0.24

## 2020-09-29 MED ORDER — BUPIVACAINE LIPOSOME 1.3 % IJ SUSP
INTRAMUSCULAR | Status: DC | PRN
  Administered 2020-09-29: 20 mL

## 2020-09-29 MED ORDER — METOCLOPRAMIDE HCL 5 MG/ML IJ SOLN: 5.0000 mg | Freq: Three times a day (TID) | INTRAMUSCULAR | Status: DC | PRN

## 2020-09-29 MED ORDER — SODIUM CHLORIDE 0.9% IV SOLUTION
INTRAVENOUS | Status: AC | PRN
  Administered 2020-09-29: 1000 mL via INTRAMUSCULAR

## 2020-09-29 MED ORDER — PROPOFOL 10 MG/ML IV BOLUS
INTRAVENOUS | Status: DC | PRN
  Administered 2020-09-29: 20 mg via INTRAVENOUS
  Administered 2020-09-29: 10 mg via INTRAVENOUS
  Administered 2020-09-29: 20 mg via INTRAVENOUS
  Administered 2020-09-29: 10 mg via INTRAVENOUS

## 2020-09-29 MED ORDER — TAMSULOSIN HCL 0.4 MG PO CAPS: 0.4000 mg | ORAL_CAPSULE | Freq: Every day | ORAL | Status: DC

## 2020-09-29 MED ORDER — LACTATED RINGERS IV SOLN: INTRAVENOUS | Status: DC

## 2020-09-29 MED ORDER — DULOXETINE HCL 60 MG PO CPEP
60.0000 mg | ORAL_CAPSULE | Freq: Every day | ORAL | Status: DC
  Administered 2020-09-30: 60 mg via ORAL
  Filled 2020-09-29: qty 1

## 2020-09-29 MED ORDER — PANTOPRAZOLE SODIUM 40 MG PO TBEC
80.0000 mg | DELAYED_RELEASE_TABLET | Freq: Every day | ORAL | Status: DC
  Administered 2020-09-30: 80 mg via ORAL
  Filled 2020-09-29: qty 2

## 2020-09-29 MED ORDER — KETOROLAC TROMETHAMINE 15 MG/ML IJ SOLN
7.5000 mg | Freq: Four times a day (QID) | INTRAMUSCULAR | Status: AC
  Administered 2020-09-29 – 2020-09-30 (×4): 7.5 mg via INTRAVENOUS
  Filled 2020-09-29 (×4): qty 1

## 2020-09-29 MED ORDER — PHENYLEPHRINE 40 MCG/ML (10ML) SYRINGE FOR IV PUSH (FOR BLOOD PRESSURE SUPPORT)
PREFILLED_SYRINGE | INTRAVENOUS | Status: AC
  Filled 2020-09-29: qty 10

## 2020-09-29 MED ORDER — METFORMIN HCL 500 MG PO TABS: 500.0000 mg | ORAL_TABLET | Freq: Two times a day (BID) | ORAL | Status: DC

## 2020-09-29 MED ORDER — BISACODYL 5 MG PO TBEC: 5.0000 mg | DELAYED_RELEASE_TABLET | Freq: Every day | ORAL | Status: DC | PRN

## 2020-09-29 MED ORDER — METHOCARBAMOL 500 MG PO TABS: 500.0000 mg | ORAL_TABLET | Freq: Four times a day (QID) | ORAL | Status: DC | PRN

## 2020-09-29 MED ORDER — POTASSIUM CHLORIDE CRYS ER 20 MEQ PO TBCR: 40.0000 meq | EXTENDED_RELEASE_TABLET | Freq: Three times a day (TID) | ORAL | Status: DC

## 2020-09-29 MED ORDER — PANTOPRAZOLE SODIUM 40 MG PO TBEC: 80.0000 mg | DELAYED_RELEASE_TABLET | Freq: Every day | ORAL | Status: DC

## 2020-09-29 MED ORDER — METFORMIN HCL 500 MG PO TABS
500.0000 mg | ORAL_TABLET | Freq: Two times a day (BID) | ORAL | Status: DC
  Administered 2020-09-29 – 2020-09-30 (×2): 500 mg via ORAL
  Filled 2020-09-29 (×2): qty 1

## 2020-09-29 MED ORDER — FUROSEMIDE 40 MG PO TABS: 80.0000 mg | ORAL_TABLET | Freq: Two times a day (BID) | ORAL | Status: DC

## 2020-09-29 MED ORDER — RIVAROXABAN 10 MG PO TABS: 20.0000 mg | ORAL_TABLET | Freq: Every day | ORAL | Status: DC

## 2020-09-29 MED ORDER — TRANEXAMIC ACID-NACL 1000-0.7 MG/100ML-% IV SOLN
1000.0000 mg | INTRAVENOUS | Status: AC
  Administered 2020-09-29: 1000 mg via INTRAVENOUS
  Filled 2020-09-29: qty 100

## 2020-09-29 MED ORDER — ONDANSETRON HCL 4 MG/2ML IJ SOLN
INTRAMUSCULAR | Status: AC
  Filled 2020-09-29: qty 2

## 2020-09-29 MED ORDER — 0.9 % SODIUM CHLORIDE (POUR BTL) OPTIME
TOPICAL | Status: DC | PRN
  Administered 2020-09-29: 1000 mL

## 2020-09-29 MED ORDER — ACETAMINOPHEN 500 MG PO TABS
1000.0000 mg | ORAL_TABLET | Freq: Four times a day (QID) | ORAL | Status: AC
  Administered 2020-09-29 – 2020-09-30 (×4): 1000 mg via ORAL
  Filled 2020-09-29 (×4): qty 2

## 2020-09-29 MED ORDER — METOCLOPRAMIDE HCL 5 MG PO TABS: 5.0000 mg | ORAL_TABLET | Freq: Three times a day (TID) | ORAL | Status: DC | PRN

## 2020-09-29 MED ORDER — BUPIVACAINE LIPOSOME 1.3 % IJ SUSP: 20.0000 mL | Freq: Once | INTRAMUSCULAR | Status: DC

## 2020-09-29 MED ORDER — INSULIN GLARGINE 100 UNIT/ML SOLOSTAR PEN: 24.0000 [IU] | PEN_INJECTOR | Freq: Every day | SUBCUTANEOUS | Status: DC

## 2020-09-29 MED ORDER — PHENYLEPHRINE 40 MCG/ML (10ML) SYRINGE FOR IV PUSH (FOR BLOOD PRESSURE SUPPORT)
PREFILLED_SYRINGE | INTRAVENOUS | Status: DC | PRN
  Administered 2020-09-29 (×4): 80 ug via INTRAVENOUS

## 2020-09-29 MED ORDER — CHLORHEXIDINE GLUCONATE CLOTH 2 % EX PADS
6.0000 | MEDICATED_PAD | Freq: Every day | CUTANEOUS | Status: DC
  Administered 2020-09-30: 6 via TOPICAL

## 2020-09-29 MED ORDER — RIVAROXABAN 10 MG PO TABS
20.0000 mg | ORAL_TABLET | Freq: Every day | ORAL | Status: DC
  Administered 2020-09-30: 20 mg via ORAL
  Filled 2020-09-29: qty 2

## 2020-09-29 MED ORDER — DULOXETINE HCL 60 MG PO CPEP: 60.0000 mg | ORAL_CAPSULE | Freq: Every day | ORAL | Status: DC

## 2020-09-29 MED ORDER — TRANEXAMIC ACID 1000 MG/10ML IV SOLN
INTRAVENOUS | Status: DC | PRN
  Administered 2020-09-29: 2000 mg via TOPICAL

## 2020-09-29 MED ORDER — POTASSIUM CHLORIDE CRYS ER 20 MEQ PO TBCR
40.0000 meq | EXTENDED_RELEASE_TABLET | Freq: Three times a day (TID) | ORAL | Status: DC
  Administered 2020-09-29 – 2020-09-30 (×2): 40 meq via ORAL
  Filled 2020-09-29 (×2): qty 2

## 2020-09-29 MED ORDER — GABAPENTIN 400 MG PO CAPS
800.0000 mg | ORAL_CAPSULE | Freq: Four times a day (QID) | ORAL | Status: DC
  Administered 2020-09-29 – 2020-09-30 (×4): 800 mg via ORAL
  Filled 2020-09-29 (×4): qty 2

## 2020-09-29 MED ORDER — POVIDONE-IODINE 10 % EX SWAB
2.0000 "application " | Freq: Once | CUTANEOUS | Status: AC
  Administered 2020-09-29: 2 via TOPICAL

## 2020-09-29 MED ORDER — TRANEXAMIC ACID-NACL 1000-0.7 MG/100ML-% IV SOLN
1000.0000 mg | Freq: Once | INTRAVENOUS | Status: AC
  Administered 2020-09-29: 1000 mg via INTRAVENOUS
  Filled 2020-09-29: qty 100

## 2020-09-29 MED ORDER — ROPINIROLE HCL 1 MG PO TABS
3.0000 mg | ORAL_TABLET | Freq: Every day | ORAL | Status: DC
  Administered 2020-09-29: 3 mg via ORAL
  Filled 2020-09-29: qty 3

## 2020-09-29 MED ORDER — GABAPENTIN 800 MG PO TABS: 800.0000 mg | ORAL_TABLET | Freq: Four times a day (QID) | ORAL | Status: DC

## 2020-09-29 MED ORDER — LIDOCAINE HCL (CARDIAC) PF 100 MG/5ML IV SOSY
PREFILLED_SYRINGE | INTRAVENOUS | Status: DC | PRN
  Administered 2020-09-29: 40 mg via INTRAVENOUS

## 2020-09-29 MED ORDER — OXYCODONE HCL 5 MG/5ML PO SOLN: 5.0000 mg | Freq: Once | ORAL | Status: DC | PRN

## 2020-09-29 MED ORDER — LINAGLIPTIN 5 MG PO TABS
5.0000 mg | ORAL_TABLET | Freq: Every day | ORAL | Status: DC
  Administered 2020-09-30: 5 mg via ORAL
  Filled 2020-09-29: qty 1

## 2020-09-29 MED ORDER — ONDANSETRON HCL 4 MG/2ML IJ SOLN: 4.0000 mg | Freq: Four times a day (QID) | INTRAMUSCULAR | Status: DC | PRN

## 2020-09-29 MED ORDER — METHOCARBAMOL 500 MG IVPB - SIMPLE MED
500.0000 mg | Freq: Four times a day (QID) | INTRAVENOUS | Status: DC | PRN
  Administered 2020-09-29: 500 mg via INTRAVENOUS
  Filled 2020-09-29: qty 50
  Filled 2020-09-29: qty 500

## 2020-09-29 MED ORDER — BUPIVACAINE-EPINEPHRINE 0.5% -1:200000 IJ SOLN: INTRAMUSCULAR | Status: DC | PRN

## 2020-09-29 MED ORDER — ONDANSETRON HCL 4 MG PO TABS: 4.0000 mg | ORAL_TABLET | Freq: Four times a day (QID) | ORAL | Status: DC | PRN

## 2020-09-29 MED ORDER — SUMATRIPTAN SUCCINATE 50 MG PO TABS
100.0000 mg | ORAL_TABLET | ORAL | Status: DC | PRN
  Filled 2020-09-29: qty 2

## 2020-09-29 MED ORDER — FUROSEMIDE 40 MG PO TABS
80.0000 mg | ORAL_TABLET | Freq: Two times a day (BID) | ORAL | Status: DC
  Administered 2020-09-29 – 2020-09-30 (×2): 80 mg via ORAL
  Filled 2020-09-29 (×2): qty 2

## 2020-09-29 MED ORDER — PROPOFOL 1000 MG/100ML IV EMUL
INTRAVENOUS | Status: AC
  Filled 2020-09-29: qty 100

## 2020-09-29 MED ORDER — BUPIVACAINE-EPINEPHRINE (PF) 0.5% -1:200000 IJ SOLN
INTRAMUSCULAR | Status: DC | PRN
  Administered 2020-09-29: 20 mL

## 2020-09-29 MED ORDER — HYDROMORPHONE HCL 1 MG/ML IJ SOLN: 0.5000 mg | INTRAMUSCULAR | Status: DC | PRN

## 2020-09-29 MED ORDER — VANCOMYCIN HCL IN DEXTROSE 1-5 GM/200ML-% IV SOLN
1000.0000 mg | Freq: Two times a day (BID) | INTRAVENOUS | Status: AC
  Administered 2020-09-29: 1000 mg via INTRAVENOUS
  Filled 2020-09-29: qty 200

## 2020-09-29 MED ORDER — BUPIVACAINE-EPINEPHRINE (PF) 0.25% -1:200000 IJ SOLN
INTRAMUSCULAR | Status: DC | PRN
  Administered 2020-09-29: 30 mL

## 2020-09-29 MED ORDER — ONDANSETRON HCL 4 MG/2ML IJ SOLN: 4.0000 mg | Freq: Once | INTRAMUSCULAR | Status: DC | PRN

## 2020-09-29 MED ORDER — OXYCODONE HCL 5 MG PO TABS
10.0000 mg | ORAL_TABLET | ORAL | Status: DC | PRN
  Administered 2020-09-29 (×2): 15 mg via ORAL
  Filled 2020-09-29 (×2): qty 3

## 2020-09-29 MED ORDER — TAMSULOSIN HCL 0.4 MG PO CAPS
0.4000 mg | ORAL_CAPSULE | Freq: Every day | ORAL | Status: DC
  Administered 2020-09-30: 0.4 mg via ORAL
  Filled 2020-09-29: qty 1

## 2020-09-29 MED ORDER — MIRTAZAPINE 15 MG PO TABS
30.0000 mg | ORAL_TABLET | Freq: Every day | ORAL | Status: DC
  Administered 2020-09-29: 30 mg via ORAL
  Filled 2020-09-29: qty 2

## 2020-09-29 MED ORDER — INSULIN ASPART 100 UNIT/ML IJ SOLN
0.0000 [IU] | Freq: Three times a day (TID) | INTRAMUSCULAR | Status: DC
  Administered 2020-09-29: 11 [IU] via SUBCUTANEOUS
  Administered 2020-09-30: 3 [IU] via SUBCUTANEOUS
  Administered 2020-09-30: 7 [IU] via SUBCUTANEOUS

## 2020-09-29 MED ORDER — MIDAZOLAM HCL 2 MG/2ML IJ SOLN
1.0000 mg | INTRAMUSCULAR | Status: DC
  Administered 2020-09-29: 1 mg via INTRAVENOUS
  Filled 2020-09-29: qty 2

## 2020-09-29 MED ORDER — ALUM & MAG HYDROXIDE-SIMETH 200-200-20 MG/5ML PO SUSP: 30.0000 mL | ORAL | Status: DC | PRN

## 2020-09-29 MED ORDER — FENTANYL CITRATE PF 50 MCG/ML IJ SOSY: 25.0000 ug | PREFILLED_SYRINGE | INTRAMUSCULAR | Status: DC | PRN

## 2020-09-29 MED ORDER — TOPIRAMATE 100 MG PO TABS
200.0000 mg | ORAL_TABLET | Freq: Two times a day (BID) | ORAL | Status: DC
  Administered 2020-09-29 – 2020-09-30 (×2): 200 mg via ORAL
  Filled 2020-09-29 (×2): qty 2

## 2020-09-29 MED ORDER — SODIUM CHLORIDE (PF) 0.9 % IJ SOLN
INTRAMUSCULAR | Status: DC | PRN
  Administered 2020-09-29: 30 mL

## 2020-09-29 MED ORDER — BUPIVACAINE IN DEXTROSE 0.75-8.25 % IT SOLN
INTRATHECAL | Status: DC | PRN
  Administered 2020-09-29: 1.8 mL via INTRATHECAL

## 2020-09-29 MED ORDER — MENTHOL 3 MG MT LOZG: 1.0000 | LOZENGE | OROMUCOSAL | Status: DC | PRN

## 2020-09-29 SURGICAL SUPPLY — 55 items
ATTUNE MED DOME PAT 38 KNEE (Knees) ×1 IMPLANT
ATTUNE PS FEM RT SZ 8 CEM KNEE (Femur) ×1 IMPLANT
ATTUNE PSRP INSR SZ8 6 KNEE (Insert) ×1 IMPLANT
BAG COUNTER SPONGE SURGICOUNT (BAG) ×2 IMPLANT
BAG DECANTER FOR FLEXI CONT (MISCELLANEOUS) ×2 IMPLANT
BAG SPEC THK2 15X12 ZIP CLS (MISCELLANEOUS) ×1
BAG SPNG CNTER NS LX DISP (BAG) ×1
BAG ZIPLOCK 12X15 (MISCELLANEOUS) ×2 IMPLANT
BASE TIBIAL ROT PLAT SZ 8 KNEE (Knees) IMPLANT
BLADE SAGITTAL 25.0X1.19X90 (BLADE) ×2 IMPLANT
BLADE SAW SGTL 11.0X1.19X90.0M (BLADE) ×2 IMPLANT
BLADE SURG SZ10 CARB STEEL (BLADE) ×2 IMPLANT
BNDG ELASTIC 6X5.8 VLCR STR LF (GAUZE/BANDAGES/DRESSINGS) ×2 IMPLANT
BOOTIES KNEE HIGH SLOAN (MISCELLANEOUS) ×2 IMPLANT
BOWL SMART MIX CTS (DISPOSABLE) ×2 IMPLANT
BSPLAT TIB 8 CMNT ROT PLAT STR (Knees) ×1 IMPLANT
CEMENT HV SMART SET (Cement) ×4 IMPLANT
COVER SURGICAL LIGHT HANDLE (MISCELLANEOUS) ×2 IMPLANT
CUFF TOURN SGL QUICK 34 (TOURNIQUET CUFF) ×2
CUFF TRNQT CYL 34X4.125X (TOURNIQUET CUFF) ×1 IMPLANT
DECANTER SPIKE VIAL GLASS SM (MISCELLANEOUS) ×4 IMPLANT
DRAPE INCISE IOBAN 66X45 STRL (DRAPES) ×6 IMPLANT
DRAPE SHEET LG 3/4 BI-LAMINATE (DRAPES) ×2 IMPLANT
DRAPE U-SHAPE 47X51 STRL (DRAPES) ×2 IMPLANT
DRSG AQUACEL AG ADV 3.5X10 (GAUZE/BANDAGES/DRESSINGS) ×2 IMPLANT
DURAPREP 26ML APPLICATOR (WOUND CARE) ×4 IMPLANT
ELECT REM PT RETURN 15FT ADLT (MISCELLANEOUS) ×2 IMPLANT
GLOVE SRG 8 PF TXTR STRL LF DI (GLOVE) ×2 IMPLANT
GLOVE SURG UNDER POLY LF SZ8 (GLOVE) ×4
GOWN STRL REUS W/TWL XL LVL3 (GOWN DISPOSABLE) ×4 IMPLANT
HANDPIECE INTERPULSE COAX TIP (DISPOSABLE) ×2
HOLDER FOLEY CATH W/STRAP (MISCELLANEOUS) IMPLANT
HOOD PEEL AWAY FLYTE STAYCOOL (MISCELLANEOUS) ×6 IMPLANT
KIT TURNOVER KIT A (KITS) ×2 IMPLANT
MANIFOLD NEPTUNE II (INSTRUMENTS) ×2 IMPLANT
NEEDLE HYPO 22GX1.5 SAFETY (NEEDLE) ×2 IMPLANT
NS IRRIG 1000ML POUR BTL (IV SOLUTION) ×2 IMPLANT
PACK TOTAL KNEE CUSTOM (KITS) ×2 IMPLANT
PAD ARMBOARD 7.5X6 YLW CONV (MISCELLANEOUS) ×2 IMPLANT
PIN DRILL FIX HALF THREAD (BIT) ×1 IMPLANT
PIN STEINMAN FIXATION KNEE (PIN) ×1 IMPLANT
PROTECTOR NERVE ULNAR (MISCELLANEOUS) ×2 IMPLANT
SET HNDPC FAN SPRY TIP SCT (DISPOSABLE) ×1 IMPLANT
SUT ETHIBOND NAB CT1 #1 30IN (SUTURE) ×4 IMPLANT
SUT VIC AB 0 CT1 36 (SUTURE) ×2 IMPLANT
SUT VIC AB 2-0 CT1 27 (SUTURE) ×2
SUT VIC AB 2-0 CT1 TAPERPNT 27 (SUTURE) ×1 IMPLANT
SUT VICRYL AB 3-0 FS1 BRD 27IN (SUTURE) ×2 IMPLANT
SUT VLOC 180 0 24IN GS25 (SUTURE) ×2 IMPLANT
TIBIAL BASE ROT PLAT SZ 8 KNEE (Knees) ×2 IMPLANT
TRAY FOL W/BAG SLVR 16FR STRL (SET/KITS/TRAYS/PACK) IMPLANT
TRAY FOLEY W/BAG SLVR 16FR LF (SET/KITS/TRAYS/PACK) ×2
WATER STERILE IRR 1000ML POUR (IV SOLUTION) ×2 IMPLANT
WRAP KNEE MAXI GEL POST OP (GAUZE/BANDAGES/DRESSINGS) ×2 IMPLANT
YANKAUER SUCT BULB TIP NO VENT (SUCTIONS) ×2 IMPLANT

## 2020-09-29 NOTE — Transfer of Care (Signed)
Immediate Anesthesia Transfer of Care Note  Patient: Craig Bauer  Procedure(s) Performed: RIGHT TOTAL KNEE ARTHROPLASTY (Right: Knee)  Patient Location: PACU  Anesthesia Type:Spinal  Level of Consciousness: drowsy and patient cooperative  Airway & Oxygen Therapy: Patient Spontanous Breathing and Patient connected to face mask oxygen  Post-op Assessment: Report given to RN and Post -op Vital signs reviewed and stable  Post vital signs: Reviewed and stable  Last Vitals:  Vitals Value Taken Time  BP 120/59 09/29/20 1416  Temp    Pulse 56 09/29/20 1417  Resp 10 09/29/20 1417  SpO2 100 % 09/29/20 1417  Vitals shown include unvalidated device data.  Last Pain:  Vitals:   09/29/20 1111  TempSrc:   PainSc: 0-No pain         Complications: No notable events documented.

## 2020-09-29 NOTE — Anesthesia Postprocedure Evaluation (Signed)
Anesthesia Post Note  Patient: Craig Bauer  Procedure(s) Performed: RIGHT TOTAL KNEE ARTHROPLASTY (Right: Knee)     Patient location during evaluation: PACU Anesthesia Type: Spinal Level of consciousness: awake and alert Pain management: pain level controlled Vital Signs Assessment: post-procedure vital signs reviewed and stable Respiratory status: spontaneous breathing and respiratory function stable Cardiovascular status: blood pressure returned to baseline and stable Postop Assessment: spinal receding and no apparent nausea or vomiting Anesthetic complications: no   No notable events documented.  Last Vitals:  Vitals:   09/29/20 1600 09/29/20 1621  BP: 118/68 121/66  Pulse: 60 (!) 58  Resp: 13 15  Temp:    SpO2: 94% 96%    Last Pain:  Vitals:   09/29/20 1530  TempSrc:   PainSc: 0-No pain                 [name]

## 2020-09-29 NOTE — Anesthesia Procedure Notes (Signed)
Anesthesia Regional Block: Adductor canal block   Pre-Anesthetic Checklist: , timeout performed,  Correct Patient, Correct Site, Correct Laterality,  Correct Procedure, Correct Position, site marked,  Risks and benefits discussed,  Surgical consent,  Pre-op evaluation,  At surgeon's request and post-op pain management  Laterality: Right  Prep: chloraprep       Needles:  Injection technique: Single-shot  Needle Type: Echogenic Needle     Needle Length: 10cm  Needle Gauge: 21     Additional Needles:   Narrative:  Start time: 09/29/2020 11:08 AM End time: 09/29/2020 11:11 AM Injection made incrementally with aspirations every 5 mL.  Performed by: Personally  Anesthesiologist: [name], MD  Additional Notes: No pain on injection. No increased resistance to injection. Injection made in 5cc increments. Good needle visualization. Patient tolerated the procedure well.

## 2020-09-29 NOTE — Evaluation (Signed)
Physical Therapy Evaluation Patient Details Name: Craig Bauer MRN: [id number] DOB: 02/29/1950 Today's Date: 09/29/2020  History of Present Illness  Patient is 70 y.o. male s/p Rt TKA on 09/29/20 with PMH significant for OA, DM, bil TSA.  Clinical Impression  Craig Bauer is a 70 y.o. male POD 0 s/p Rt TKA. Patient reports independence with occasional use of SPC for mobility at baseline. Patient is now limited by functional impairments (see PT problem list below) and requires min guard/assist for transfers and gait with RW. Patient was able to ambulate ~60 feet with RW and min assist. Patient instructed in exercise to facilitate circulation to manage edema and reduce risk of DVT. Patient will benefit from continued skilled PT interventions to address impairments and progress towards PLOF. Acute PT will follow to progress mobility and stair training in preparation for safe discharge home.        Recommendations for follow up therapy are one component of a multi-disciplinary discharge planning process, led by the attending physician.  Recommendations may be updated based on patient status, additional functional criteria and insurance authorization.  Follow Up Recommendations Follow surgeon's recommendation for DC plan and follow-up therapies    Equipment Recommendations  Rolling walker with 5" wheels    Recommendations for Other Services       Precautions / Restrictions Precautions Precautions: Fall Restrictions Weight Bearing Restrictions: No Other Position/Activity Restrictions: WBAT      Mobility  Bed Mobility               General bed mobility comments: pt OOB in recliner    Transfers Overall transfer level: Needs assistance Equipment used: Rolling walker (2 wheeled) Transfers: Sit to/from Stand Sit to Stand: Min guard         General transfer comment: cues for hand placement on armrest for power up, close guard for safety. cues for reach back and to extend Rt knee  to prevent excessive flexion.  Ambulation/Gait Ambulation/Gait assistance: Min assist Gait Distance (Feet): 60 Feet Assistive device: Rolling walker (2 wheeled) Gait Pattern/deviations: Step-to pattern;Decreased stride length;Decreased weight shift to right     General Gait Details: cues for step pattern and to maintain safe proximity to RW. no overt LOB or Rt knee buckling noted.  Stairs            Wheelchair Mobility    Modified Rankin (Stroke Patients Only)       Balance Overall balance assessment: Needs assistance Sitting-balance support: Feet supported Sitting balance-Leahy Scale: Good     Standing balance support: During functional activity;Bilateral upper extremity supported Standing balance-Leahy Scale: Fair                               Pertinent Vitals/Pain Pain Assessment: 0-10 Pain Score: 10-Worst pain ever Pain Location: Rt knee Pain Descriptors / Indicators: Aching;Discomfort Pain Intervention(s): Limited activity within patient's tolerance;Monitored during session;Repositioned;Ice applied    Home Living Family/patient expects to be discharged to:: Private residence Living Arrangements: Spouse/significant other Available Help at Discharge: Family Type of Home: Mobile home Home Access: Stairs to enter Entrance Stairs-Rails: Right;Left;Can reach both Entrance Stairs-Number of Steps: 4 Home Layout: One level Home Equipment: Cane - single point      Prior Function Level of Independence: Independent               Hand Dominance   Dominant Hand: Right    Extremity/Trunk Assessment   Upper Extremity Assessment Upper  Extremity Assessment: Overall WFL for tasks assessed    Lower Extremity Assessment Lower Extremity Assessment: RLE deficits/detail RLE Deficits / Details: good quad activation, no extensor lag with LAQ RLE Sensation: WNL RLE Coordination: WNL    Cervical / Trunk Assessment Cervical / Trunk Assessment:  Normal  Communication   Communication: No difficulties  Cognition Arousal/Alertness: Awake/alert Behavior During Therapy: WFL for tasks assessed/performed Overall Cognitive Status: Within Functional Limits for tasks assessed                                        General Comments      Exercises Total Joint Exercises Ankle Circles/Pumps: AROM;Both;20 reps;Seated   Assessment/Plan    PT Assessment Patient needs continued PT services  PT Problem List Decreased strength;Decreased range of motion;Decreased activity tolerance;Decreased balance;Decreased mobility;Decreased knowledge of use of DME;Decreased knowledge of precautions;Pain;Obesity       PT Treatment Interventions DME instruction;Gait training;Stair training;Functional mobility training;Therapeutic activities;Therapeutic exercise;Balance training;Patient/family education    PT Goals (Current goals can be found in the Care Plan section)  Acute Rehab PT Goals Patient Stated Goal: get home and keep up with grandkids PT Goal Formulation: With patient Time For Goal Achievement: 10/06/20 Potential to Achieve Goals: Good    Frequency 7X/week   Barriers to discharge        Co-evaluation               AM-PAC PT "6 Clicks" Mobility  Outcome Measure Help needed turning from your back to your side while in a flat bed without using bedrails?: A Little Help needed moving from lying on your back to sitting on the side of a flat bed without using bedrails?: A Little Help needed moving to and from a bed to a chair (including a wheelchair)?: A Little Help needed standing up from a chair using your arms (e.g., wheelchair or bedside chair)?: A Little Help needed to walk in hospital room?: A Little Help needed climbing 3-5 steps with a railing? : A Lot 6 Click Score: 17    End of Session Equipment Utilized During Treatment: Gait belt Activity Tolerance: Patient tolerated treatment well Patient left: in  chair;with call bell/phone within reach;with chair alarm set Nurse Communication: Mobility status;Patient requests pain meds PT Visit Diagnosis: Muscle weakness (generalized) (M62.81);Difficulty in walking, not elsewhere classified (R26.2)    Time: 4259-5638 PT Time Calculation (min) (ACUTE ONLY): 22 min   Charges:   PT Evaluation $PT Eval Low Complexity: 1 Low          Wynn Maudlin, DPT Acute Rehabilitation Services Office [phone] Pager [phone]   Anitra Lauth 09/29/2020, 6:50 PM

## 2020-09-29 NOTE — Op Note (Signed)
PREOP DIAGNOSIS: DJD RIGHT KNEE POSTOP DIAGNOSIS: same PROCEDURE: RIGHT TKR ANESTHESIA: Spinal and MAC ATTENDING SURGEON: Hessie Dibble ASSISTANT: Loni Dolly PA  INDICATIONS FOR PROCEDURE: Craig Bauer is a 70 y.o. male who has struggled for a long time with pain due to degenerative arthritis of the right knee.  The patient has failed many conservative non-operative measures and at this point has pain which limits the ability to sleep and walk.  The patient is offered total knee replacement.  Informed operative consent was obtained after discussion of possible risks of anesthesia, infection, neurovascular injury, DVT, and death.  The importance of the post-operative rehabilitation protocol to optimize result was stressed extensively with the patient.  SUMMARY OF FINDINGS AND PROCEDURE:  Craig Bauer was taken to the operative suite where under the above anesthesia a right knee replacement was performed.  There were advanced degenerative changes and the bone quality was good.  We used the DePuy Attune system and placed size 8 femur, 8 tibia, 38 mm all polyethylene patella, and a size 6 mm spacer.  Loni Dolly PA-C assisted throughout and was invaluable to the completion of the case in that he helped retract and maintain exposure while I placed components.  He also helped close thereby minimizing OR time.  The patient was admitted for appropriate post-op care to include perioperative antibiotics and mechanical and pharmacologic measures for DVT prophylaxis.  DESCRIPTION OF PROCEDURE:  [name] was taken to the operative suite where the above anesthesia was applied.  The patient was positioned supine and prepped and draped in normal sterile fashion.  An appropriate time out was performed.  After the administration of vancomycin pre-op antibiotic the leg was elevated and exsanguinated and a tourniquet inflated. A standard longitudinal incision was made on the anterior knee.  Dissection was carried  down to the extensor mechanism.  All appropriate anti-infective measures were used including the pre-operative antibiotic, betadine impregnated drape, and closed hooded exhaust systems for each member of the surgical team.  A medial parapatellar incision was made in the extensor mechanism and the knee cap flipped and the knee flexed.  Some residual meniscal tissues were removed along with any remaining ACL/PCL tissue.  A guide was placed on the tibia and a flat cut was made on it's superior surface.  An intramedullary guide was placed in the femur and was utilized to make anterior and posterior cuts creating an appropriate flexion gap.  A second intramedullary guide was placed in the femur to make a distal cut properly balancing the knee with an extension gap equal to the flexion gap.  The three bones sized to the above mentioned sizes and the appropriate guides were placed and utilized.  A trial reduction was done and the knee easily came to full extension and the patella tracked well on flexion.  The trial components were removed and all bones were cleaned with pulsatile lavage and then dried thoroughly.  Cement was mixed and was pressurized onto the bones followed by placement of the aforementioned components.  Excess cement was trimmed and pressure was held on the components until the cement had hardened.  The tourniquet was deflated and a small amount of bleeding was controlled with cautery and pressure.  The knee was irrigated thoroughly.  The extensor mechanism was re-approximated with #1 ethibond in interrupted fashion.  The knee was flexed and the repair was solid.  The subcutaneous tissues were re-approximated with #0 and #2-0 vicryl and the skin closed with a subcuticular stitch  and steristrips.  A sterile dressing was applied.  Intraoperative fluids, EBL, and tourniquet time can be obtained from anesthesia records.  DISPOSITION:  The patient was taken to recovery room in stable condition and admitted  for appropriate post-op care to include peri-operative antibiotic and DVT prophylaxis with mechanical and pharmacologic measures.  Hessie Dibble 09/29/2020, 1:51 PM

## 2020-09-29 NOTE — Interval H&P Note (Signed)
History and Physical Interval Note:  09/29/2020 11:18 AM  Craig Bauer  has presented today for surgery, with the diagnosis of RIGHT KNEE DEGENERATIVE JOINT DISEASE.  The various methods of treatment have been discussed with the patient and family. After consideration of risks, benefits and other options for treatment, the patient has consented to  Procedure(s): RIGHT TOTAL KNEE ARTHROPLASTY (Right) as a surgical intervention.  The patient's history has been reviewed, patient examined, no change in status, stable for surgery.  I have reviewed the patient's chart and labs.  Questions were answered to the patient's satisfaction.     Velna Ochs

## 2020-09-29 NOTE — Progress Notes (Addendum)
AssistedDr. Mal Amabile with right ultrasound guided, adductor canal block. Side rails up, monitors on throughout procedure. See vital signs in flow sheet. Tolerated Procedure well.

## 2020-09-29 NOTE — Anesthesia Procedure Notes (Signed)
Spinal  Patient location during procedure: OR Start time: 09/29/2020 12:09 PM End time: 09/29/2020 12:15 PM Reason for block: surgical anesthesia Staffing Performed: resident/CRNA  Anesthesiologist: [name], MD Resident/CRNA: Yolonda Kida, CRNA Preanesthetic Checklist Completed: patient identified, IV checked, site marked, risks and benefits discussed, surgical consent, monitors and equipment checked, pre-op evaluation and timeout performed Spinal Block Patient position: sitting Prep: DuraPrep Patient monitoring: blood pressure, continuous pulse ox and heart rate Approach: midline Location: L3-4 Injection technique: single-shot Needle Needle type: Pencan  Needle gauge: 24 G Assessment Sensory level: T4 Events: CSF return

## 2020-09-30 DIAGNOSIS — M1711 Unilateral primary osteoarthritis, right knee: Secondary | ICD-10-CM | POA: Diagnosis not present

## 2020-09-30 LAB — GLUCOSE, CAPILLARY
Glucose-Capillary: 237 mg/dL — ABNORMAL HIGH (ref 70–99)
Glucose-Capillary: 152 mg/dL — ABNORMAL HIGH (ref 70–99)

## 2020-09-30 NOTE — TOC Transition Note (Signed)
Transition of Care [facility]) - CM/SW Discharge Note   Patient Details  Name: Jamion Carter MRN: [id number] Date of Birth: 02/14/1950  Transition of Care [facility] CM/SW Contact:  Lennart Pall, LCSW Phone Number: 09/30/2020, 9:29 AM   Clinical Narrative:    Met with pt and confirming he is to received rw via [place].  HHPT with [place] prearranged via MD office.  No further TOC needs   Final next level of care: [place] Barriers to Discharge: No Barriers Identified   Patient Goals and CMS Choice Patient states their goals for this hospitalization and ongoing recovery are:: return home      Discharge Placement                       Discharge Plan and Services                DME Arranged: Walker rolling DME Agency: [employer]       HH Arranged: PT [place] Agency: [facility]        Social Determinants of Health (SDOH) Interventions     Readmission Risk Interventions No flowsheet data found.

## 2020-09-30 NOTE — Progress Notes (Signed)
Subjective: 1 Day Post-Op Procedure(s) (LRB): RIGHT TOTAL KNEE ARTHROPLASTY (Right)  Patient doing well. He is hoping to go home today.  Activity level:  wbat Diet tolerance:  ok Voiding:  ok Patient reports pain as mild.    Objective: Vital signs in last 24 hours: Temp:  [97.6 F (36.4 C)-99 F (37.2 C)] 98.6 F (37 C) (09/21 0532) Pulse Rate:  [57-89] 74 (09/21 0532) Resp:  [11-20] 20 (09/21 0532) BP: (112-145)/(59-95) 127/68 (09/21 0532) SpO2:  [94 %-100 %] 98 % (09/21 0532) Weight:  [111.1 kg] 111.1 kg (09/20 1702)  Labs: No results for input(s): HGB in the last 72 hours. No results for input(s): WBC, RBC, HCT, PLT in the last 72 hours. No results for input(s): NA, K, CL, CO2, BUN, CREATININE, GLUCOSE, CALCIUM in the last 72 hours. Recent Labs    09/29/20 0952  INR 1.0    Physical Exam:  Neurologically intact ABD soft Neurovascular intact Sensation intact distally Intact pulses distally Dorsiflexion/Plantar flexion intact Incision: dressing C/D/I and scant drainage No cellulitis present Compartment soft  Assessment/Plan:  1 Day Post-Op Procedure(s) (LRB): RIGHT TOTAL KNEE ARTHROPLASTY (Right) Advance diet Up with therapy D/C IV fluids Discharge home with home health today after PT if cleared and doing well. Continue on baseline xarelto. Follow up in office 2 weeks post op.  Ginger Organ Jaymie Mckiddy 09/30/2020, 7:44 AM

## 2020-09-30 NOTE — Discharge Summary (Signed)
Patient ID: Craig Bauer MRN: [id number] DOB/AGE: 07/16/1950 70 y.o.  Admit date: 09/29/2020 Discharge date: 09/30/2020  Admission Diagnoses:  Principal Problem:   Primary localized osteoarthritis of right knee Active Problems:   Primary osteoarthritis of right knee   Discharge Diagnoses:  Same  Past Medical History:  Diagnosis Date   Arthritis    Blood clot in vein    Diabetes mellitus without complication (HCC)    Headache    migraine   Sleep apnea     Surgeries: Procedure(s): RIGHT TOTAL KNEE ARTHROPLASTY on 09/29/2020   Consultants:   Discharged Condition: Improved  Hospital Course: Donavin Audino is an 70 y.o. male who was admitted 09/29/2020 for operative treatment ofPrimary localized osteoarthritis of right knee. Patient has severe unremitting pain that affects sleep, daily activities, and work/hobbies. After pre-op clearance the patient was taken to the operating room on 09/29/2020 and underwent  Procedure(s): RIGHT TOTAL KNEE ARTHROPLASTY.    Patient was given perioperative antibiotics:  Anti-infectives (From admission, onward)    Start     Dose/Rate Route Frequency Ordered Stop   09/29/20 2300  vancomycin (VANCOCIN) IVPB 1000 mg/200 mL premix        1,000 mg 200 mL/hr over 60 Minutes Intravenous Every 12 hours 09/29/20 1658 09/30/20 0026   09/29/20 0600  vancomycin (VANCOREADY) IVPB 1500 mg/300 mL        1,500 mg 150 mL/hr over 120 Minutes Intravenous On call to O.R. 09/28/20 0758 09/29/20 1314        Patient was given sequential compression devices, early ambulation, and chemoprophylaxis to prevent DVT.  Patient benefited maximally from hospital stay and there were no complications.    Recent vital signs: Patient Vitals for the past 24 hrs:  BP Temp Temp src Pulse Resp SpO2 Height Weight  09/30/20 0532 127/68 98.6 F (37 C) Oral 74 20 98 % -- --  09/30/20 0108 113/68 99 F (37.2 C) -- 89 17 -- -- --  09/29/20 2131 (!) 112/59 98.9 F (37.2 C) -- 77 18  97 % -- --  09/29/20 1702 129/65 98.1 F (36.7 C) -- 63 16 98 % 5' 7.5" (1.715 m) 111.1 kg  09/29/20 1621 121/66 -- -- (!) 58 15 96 % -- --  09/29/20 1600 118/68 -- -- 60 13 94 % -- --  09/29/20 1545 112/62 -- -- 69 20 96 % -- --  09/29/20 1500 (!) 117/95 -- -- (!) 58 18 98 % -- --  09/29/20 1445 123/66 -- -- (!) 58 11 97 % -- --  09/29/20 1430 115/70 -- -- (!) 57 11 100 % -- --  09/29/20 1415 (!) 120/59 97.6 F (36.4 C) -- (!) 59 12 99 % -- --  09/29/20 1111 126/70 -- -- 64 14 96 % -- --  09/29/20 1107 (!) 145/65 -- -- 66 17 95 % -- --  09/29/20 1026 -- -- -- -- -- -- 5' 5.7" (1.669 m) 111.1 kg  09/29/20 1015 (!) 143/90 98.7 F (37.1 C) Oral 80 20 95 % -- --     Recent laboratory studies:  Recent Labs    09/29/20 0952  INR 1.0     Discharge Medications:   Allergies as of 09/30/2020       Reactions   Contrast Media [iodinated Diagnostic Agents] Anaphylaxis   Ciprofloxacin Hives   Latex Hives   Penicillins Hives        Medication List     STOP taking these medications  meloxicam 15 MG tablet Commonly known as: MOBIC   naproxen sodium 220 MG tablet Commonly known as: ALEVE       TAKE these medications    bisacodyl 5 MG EC tablet Commonly known as: DULCOLAX Take 5 mg by mouth daily as needed for moderate constipation.   cyclobenzaprine 10 MG tablet Commonly known as: FLEXERIL Take 10 mg by mouth 2 (two) times daily as needed for muscle spasms.   cyclobenzaprine 10 MG tablet Commonly known as: FLEXERIL Take 10 mg by mouth 2 (two) times daily as needed for pain.   diclofenac Sodium 1 % Gel Commonly known as: VOLTAREN Apply 1 application topically 4 (four) times daily as needed (pain).   DULoxetine 60 MG capsule Commonly known as: CYMBALTA Take 60 mg by mouth daily.   DULoxetine 60 MG capsule Commonly known as: CYMBALTA Take 60 mg by mouth daily.   furosemide 40 MG tablet Commonly known as: LASIX Take 80 mg by mouth 2 (two) times daily.    furosemide 40 MG tablet Commonly known as: LASIX Take 80 mg by mouth 2 (two) times daily.   gabapentin 800 MG tablet Commonly known as: NEURONTIN Take 800 mg by mouth 4 (four) times daily.   gabapentin 800 MG tablet Commonly known as: NEURONTIN Take 800 mg by mouth 4 (four) times daily.   Lantus SoloStar 100 UNIT/ML Solostar Pen Generic drug: insulin glargine Inject 24 Units into the skin at bedtime.   Lidocaine 2 % Gel Apply 1 application topically daily as needed (pain).   metFORMIN 500 MG tablet Commonly known as: GLUCOPHAGE Take 500 mg by mouth 2 (two) times daily.   metFORMIN 500 MG tablet Commonly known as: GLUCOPHAGE Take 500 mg by mouth 2 (two) times daily.   mirtazapine 30 MG tablet Commonly known as: REMERON Take 30 mg by mouth at bedtime.   mirtazapine 30 MG tablet Commonly known as: REMERON Take 30 mg by mouth at bedtime.   omeprazole 40 MG capsule Commonly known as: PRILOSEC Take 40 mg by mouth daily.   omeprazole 40 MG capsule Commonly known as: PRILOSEC Take 40 mg by mouth daily.   Oxycodone HCl 20 MG Tabs Take 20 mg by mouth 4 (four) times daily as needed (pain).   Oxycodone HCl 20 MG Tabs Take 20 mg by mouth every 4 (four) hours as needed for pain.   Ozempic (0.25 or 0.5 MG/DOSE) 2 MG/1.5ML Sopn Generic drug: Semaglutide(0.25 or 0.5MG /DOS) Inject 0.5 mg into the skin every Friday.   Ozempic (0.25 or 0.5 MG/DOSE) 2 MG/1.5ML Sopn Generic drug: Semaglutide(0.25 or 0.5MG /DOS) Inject 0.3 mLs into the skin once a week.   potassium chloride SA 20 MEQ tablet Commonly known as: KLOR-CON Take 40 mEq by mouth 3 (three) times daily.   potassium chloride SA 20 MEQ tablet Commonly known as: KLOR-CON Take 40 mEq by mouth 3 (three) times daily.   rOPINIRole 1 MG tablet Commonly known as: REQUIP Take 3 mg by mouth at bedtime.   rOPINIRole 1 MG tablet Commonly known as: REQUIP Take 3 mg by mouth at bedtime.   sitaGLIPtin 100 MG  tablet Commonly known as: JANUVIA Take 100 mg by mouth daily.   SUMAtriptan 100 MG tablet Commonly known as: IMITREX Take 100 mg by mouth every 2 (two) hours as needed for migraine. May repeat in 2 hours if headache persists or recurs.   tamsulosin 0.4 MG Caps capsule Commonly known as: FLOMAX Take 0.4 mg by mouth daily.   tamsulosin 0.4  MG Caps capsule Commonly known as: FLOMAX Take 0.4 mg by mouth daily.   topiramate 200 MG tablet Commonly known as: TOPAMAX Take 200 mg by mouth 2 (two) times daily.   rivaroxaban 20 MG Tabs tablet Commonly known as: XARELTO Take 20 mg by mouth daily.   Xarelto 20 MG Tabs tablet Generic drug: rivaroxaban Take 20 mg by mouth daily.   Xtampza ER 36 MG C12a Generic drug: oxyCODONE ER Take 36 mg by mouth every 8 (eight) hours.   Xtampza ER 36 MG C12a Generic drug: oxyCODONE ER Take 36 mg by mouth every 8 (eight) hours.               Durable Medical Equipment  (From admission, onward)           Start     Ordered   09/29/20 1658  DME Walker rolling  Once       Question:  Patient needs a walker to treat with the following condition  Answer:  Primary osteoarthritis of right knee   09/29/20 1657   09/29/20 1658  DME 3 n 1  Once        09/29/20 1657   09/29/20 1658  DME Bedside commode  Once       Question:  Patient needs a bedside commode to treat with the following condition  Answer:  Primary osteoarthritis of right knee   09/29/20 1657            Diagnostic Studies: DG Chest 2 View  Result Date: 09/22/2020 CLINICAL DATA:  70 year old male with a history of preoperative chest x-ray EXAM: CHEST - 2 VIEW COMPARISON:  06/03/2020 FINDINGS: Cardiomediastinal silhouette unchanged in size and contour. Stigmata of emphysema, with increased retrosternal airspace, flattened hemidiaphragms, increased AP diameter, and hyperinflation on the AP view. Similar appearance of the coarsened interstitial markings with no confluent airspace  disease. No interlobular septal thickening. No central vascular congestion. No displaced fracture.  Degenerative changes of the spine. IMPRESSION: Similar appearance of the chest x-ray to comparisons, with favored chronic changes and no evidence of acute cardiopulmonary disease Electronically Signed   By: Gilmer Mor D.O.   On: 09/22/2020 12:43    Disposition: Discharge disposition: 01-Home or Self Care       Discharge Instructions     Call MD / Call 911   Complete by: As directed    If you experience chest pain or shortness of breath, CALL 911 and be transported to the hospital emergency room.  If you develope a fever above 101 F, pus (white drainage) or increased drainage or redness at the wound, or calf pain, call your surgeon's office.   Constipation Prevention   Complete by: As directed    Drink plenty of fluids.  Prune juice may be helpful.  You may use a stool softener, such as Colace (over the counter) 100 mg twice a day.  Use MiraLax (over the counter) for constipation as needed.   Diet - low sodium heart healthy   Complete by: As directed    Discharge instructions   Complete by: As directed    INSTRUCTIONS AFTER JOINT REPLACEMENT   Remove items at home which could result in a fall. This includes throw rugs or furniture in walking pathways ICE to the affected joint every three hours while awake for 30 minutes at a time, for at least the first 3-5 days, and then as needed for pain and swelling.  Continue to use ice for pain and swelling.  You may notice swelling that will progress down to the foot and ankle.  This is normal after surgery.  Elevate your leg when you are not up walking on it.   Continue to use the breathing machine you got in the hospital (incentive spirometer) which will help keep your temperature down.  It is common for your temperature to cycle up and down following surgery, especially at night when you are not up moving around and exerting yourself.  The breathing  machine keeps your lungs expanded and your temperature down.   DIET:  As you were doing prior to hospitalization, we recommend a well-balanced diet.  DRESSING / WOUND CARE / SHOWERING  You may shower 3 days after surgery, but keep the wounds dry during showering.  You may use an occlusive plastic wrap (Press'n Seal for example), NO SOAKING/SUBMERGING IN THE BATHTUB.  If the bandage gets wet, change with a clean dry gauze.  If the incision gets wet, pat the wound dry with a clean towel.  ACTIVITY  Increase activity slowly as tolerated, but follow the weight bearing instructions below.   No driving for 6 weeks or until further direction given by your physician.  You cannot drive while taking narcotics.  No lifting or carrying greater than 10 lbs. until further directed by your surgeon. Avoid periods of inactivity such as sitting longer than an hour when not asleep. This helps prevent blood clots.  You may return to work once you are authorized by your doctor.     WEIGHT BEARING   Weight bearing as tolerated with assist device (walker, cane, etc) as directed, use it as long as suggested by your surgeon or therapist, typically at least 4-6 weeks.   EXERCISES  Results after joint replacement surgery are often greatly improved when you follow the exercise, range of motion and muscle strengthening exercises prescribed by your doctor. Safety measures are also important to protect the joint from further injury. Any time any of these exercises cause you to have increased pain or swelling, decrease what you are doing until you are comfortable again and then slowly increase them. If you have problems or questions, call your caregiver or physical therapist for advice.   Rehabilitation is important following a joint replacement. After just a few days of immobilization, the muscles of the leg can become weakened and shrink (atrophy).  These exercises are designed to build up the tone and strength of the  thigh and leg muscles and to improve motion. Often times heat used for twenty to thirty minutes before working out will loosen up your tissues and help with improving the range of motion but do not use heat for the first two weeks following surgery (sometimes heat can increase post-operative swelling).   These exercises can be done on a training (exercise) mat, on the floor, on a table or on a bed. Use whatever works the best and is most comfortable for you.    Use music or television while you are exercising so that the exercises are a pleasant break in your day. This will make your life better with the exercises acting as a break in your routine that you can look forward to.   Perform all exercises about fifteen times, three times per day or as directed.  You should exercise both the operative leg and the other leg as well.  Exercises include:   Quad Sets - Tighten up the muscle on the front of the thigh (Quad) and hold for 5-10  seconds.   Straight Leg Raises - With your knee straight (if you were given a brace, keep it on), lift the leg to 60 degrees, hold for 3 seconds, and slowly lower the leg.  Perform this exercise against resistance later as your leg gets stronger.  Leg Slides: Lying on your back, slowly slide your foot toward your buttocks, bending your knee up off the floor (only go as far as is comfortable). Then slowly slide your foot back down until your leg is flat on the floor again.  Angel Wings: Lying on your back spread your legs to the side as far apart as you can without causing discomfort.  Hamstring Strength:  Lying on your back, push your heel against the floor with your leg straight by tightening up the muscles of your buttocks.  Repeat, but this time bend your knee to a comfortable angle, and push your heel against the floor.  You may put a pillow under the heel to make it more comfortable if necessary.   A rehabilitation program following joint replacement surgery can speed  recovery and prevent re-injury in the future due to weakened muscles. Contact your doctor or a physical therapist for more information on knee rehabilitation.    CONSTIPATION  Constipation is defined medically as fewer than three stools per week and severe constipation as less than one stool per week.  Even if you have a regular bowel pattern at home, your normal regimen is likely to be disrupted due to multiple reasons following surgery.  Combination of anesthesia, postoperative narcotics, change in appetite and fluid intake all can affect your bowels.   YOU MUST use at least one of the following options; they are listed in order of increasing strength to get the job done.  They are all available over the counter, and you may need to use some, POSSIBLY even all of these options:    Drink plenty of fluids (prune juice may be helpful) and high fiber foods Colace 100 mg by mouth twice a day  Senokot for constipation as directed and as needed Dulcolax (bisacodyl), take with full glass of water  Miralax (polyethylene glycol) once or twice a day as needed.  If you have tried all these things and are unable to have a bowel movement in the first 3-4 days after surgery call either your surgeon or your primary doctor.    If you experience loose stools or diarrhea, hold the medications until you stool forms back up.  If your symptoms do not get better within 1 week or if they get worse, check with your doctor.  If you experience "the worst abdominal pain ever" or develop nausea or vomiting, please contact the office immediately for further recommendations for treatment.   ITCHING:  If you experience itching with your medications, try taking only a single pain pill, or even half a pain pill at a time.  You can also use Benadryl over the counter for itching or also to help with sleep.   TED HOSE STOCKINGS:  Use stockings on both legs until for at least 2 weeks or as directed by physician office. They may be  removed at night for sleeping.  MEDICATIONS:  See your medication summary on the "After Visit Summary" that nursing will review with you.  You may have some home medications which will be placed on hold until you complete the course of blood thinner medication.  It is important for you to complete the blood thinner medication as prescribed.  PRECAUTIONS:  If you experience chest pain or shortness of breath - call 911 immediately for transfer to the hospital emergency department.   If you develop a fever greater that 101 F, purulent drainage from wound, increased redness or drainage from wound, foul odor from the wound/dressing, or calf pain - CONTACT YOUR SURGEON.                                                   FOLLOW-UP APPOINTMENTS:  If you do not already have a post-op appointment, please call the office for an appointment to be seen by your surgeon.  Guidelines for how soon to be seen are listed in your "After Visit Summary", but are typically between 1-4 weeks after surgery.  OTHER INSTRUCTIONS:   Knee Replacement:  Do not place pillow under knee, focus on keeping the knee straight while resting. CPM instructions: 0-90 degrees, 2 hours in the morning, 2 hours in the afternoon, and 2 hours in the evening. Place foam block, curve side up under heel at all times except when in CPM or when walking.  DO NOT modify, tear, cut, or change the foam block in any way.  POST-OPERATIVE OPIOID TAPER INSTRUCTIONS: It is important to wean off of your opioid medication as soon as possible. If you do not need pain medication after your surgery it is ok to stop day one. Opioids include: Codeine, Hydrocodone(Norco, Vicodin), Oxycodone(Percocet, oxycontin) and hydromorphone amongst others.  Long term and even short term use of opiods can cause: Increased pain response Dependence Constipation Depression Respiratory depression And more.  Withdrawal symptoms can include Flu like symptoms Nausea,  vomiting And more Techniques to manage these symptoms Hydrate well Eat regular healthy meals Stay active Use relaxation techniques(deep breathing, meditating, yoga) Do Not substitute Alcohol to help with tapering If you have been on opioids for less than two weeks and do not have pain than it is ok to stop all together.  Plan to wean off of opioids This plan should start within one week post op of your joint replacement. Maintain the same interval or time between taking each dose and first decrease the dose.  Cut the total daily intake of opioids by one tablet each day Next start to increase the time between doses. The last dose that should be eliminated is the evening dose.     MAKE SURE YOU:  Understand these instructions.  Get help right away if you are not doing well or get worse.    Thank you for letting us be a part of your medical care team.  It is a privilege we respect greatly.  We hope these instructions will help you stay on track for a fast and full recovery!   Increase activity slowly as tolerated   Complete by: As directed    Post-operative opioid taper instructions:   Complete by: As directed    POST-OPERATIVE OPIOID TAPER INSTRUCTIONS: It is important to wean off of your opioid medication as soon as possible. If you do not need pain medication after your surgery it is ok to stop day one. Opioids include: Codeine, Hydrocodone(Norco, Vicodin), Oxycodone(Percocet, oxycontin) and hydromorphone amongst others.  Long term and even short term use of opiods can cause: Increased pain response Dependence Constipation Depression Respiratory depression And more.  Withdrawal symptoms can include Flu like symptoms Nausea, vomiting  And more Techniques to manage these symptoms Hydrate well Eat regular healthy meals Stay active Use relaxation techniques(deep breathing, meditating, yoga) Do Not substitute Alcohol to help with tapering If you have been on opioids for  less than two weeks and do not have pain than it is ok to stop all together.  Plan to wean off of opioids This plan should start within one week post op of your joint replacement. Maintain the same interval or time between taking each dose and first decrease the dose.  Cut the total daily intake of opioids by one tablet each day Next start to increase the time between doses. The last dose that should be eliminated is the evening dose.           Follow-up Information     Marcene Corning, MD. Schedule an appointment as soon as possible for a visit in 2 week(s).   Specialty: Orthopedic Surgery Contact information: [address] [place] [address] [phone]                  Signed: Ginger Organ Ladainian Therien 09/30/2020, 7:48 AM

## 2020-09-30 NOTE — Progress Notes (Signed)
Discharge package printed and instructions given to pt and wife. Verbalize understanding.

## 2020-09-30 NOTE — Progress Notes (Signed)
Physical Therapy Treatment Patient Details Name: Craig Bauer MRN: [id number] DOB: 18-Jul-1950 Today's Date: 09/30/2020   History of Present Illness Patient is 70 y.o. male s/p Rt TKA on 09/29/20 with PMH significant for OA, DM, bil TSA.    PT Comments    Pt assisted with ambulating in hallway.  Observed significant left knee genu varum.  Pt also performed LE exercises for right knee and provided with HEP handout.    Recommendations for follow up therapy are one component of a multi-disciplinary discharge planning process, led by the attending physician.  Recommendations may be updated based on patient status, additional functional criteria and insurance authorization.  Follow Up Recommendations  Follow surgeon's recommendation for DC plan and follow-up therapies     Equipment Recommendations  Rolling walker with 5" wheels    Recommendations for Other Services       Precautions / Restrictions Precautions Precautions: Fall;Knee Restrictions RLE Weight Bearing: Weight bearing as tolerated     Mobility  Bed Mobility Overal bed mobility: Needs Assistance Bed Mobility: Supine to Sit     Supine to sit: Supervision;HOB elevated          Transfers Overall transfer level: Needs assistance Equipment used: Rolling walker (2 wheeled) Transfers: Sit to/from Stand Sit to Stand: Min guard         General transfer comment: verbal cues for UE and LE positioning  Ambulation/Gait Ambulation/Gait assistance: Min guard;Min assist Gait Distance (Feet): 120 Feet Assistive device: Rolling walker (2 wheeled) Gait Pattern/deviations: Step-to pattern;Decreased weight shift to right;Antalgic Gait velocity: decr   General Gait Details: verbal cues for sequence, RW positioning, step length; pt a little shaky and unsteady initially requiring min assist however improved with distance   Stairs             Wheelchair Mobility    Modified Rankin (Stroke Patients Only)        Balance                                            Cognition Arousal/Alertness: Awake/alert Behavior During Therapy: WFL for tasks assessed/performed Overall Cognitive Status: Within Functional Limits for tasks assessed                                        Exercises Total Joint Exercises Ankle Circles/Pumps: AROM;Both;10 reps Quad Sets: AROM;Both;10 reps Heel Slides: AAROM;Right;10 reps Hip ABduction/ADduction: AROM;Right;10 reps Straight Leg Raises: AROM;Right;10 reps Long Arc Quad: AROM;Right;10 reps;Seated Knee Flexion: AAROM;Right;10 reps;Seated    General Comments        Pertinent Vitals/Pain Pain Assessment: 0-10 Pain Score: 5  Pain Location: Rt knee Pain Descriptors / Indicators: Aching;Discomfort;Sore Pain Intervention(s): Repositioned;Monitored during session    Home Living                      Prior Function            PT Goals (current goals can now be found in the care plan section) Progress towards PT goals: Progressing toward goals    Frequency    7X/week      PT Plan Current plan remains appropriate    Co-evaluation              AM-PAC PT "6 Clicks" Mobility  Outcome Measure  Help needed turning from your back to your side while in a flat bed without using bedrails?: A Little Help needed moving from lying on your back to sitting on the side of a flat bed without using bedrails?: A Little Help needed moving to and from a bed to a chair (including a wheelchair)?: A Little Help needed standing up from a chair using your arms (e.g., wheelchair or bedside chair)?: A Little Help needed to walk in hospital room?: A Little Help needed climbing 3-5 steps with a railing? : A Lot 6 Click Score: 17    End of Session Equipment Utilized During Treatment: Gait belt Activity Tolerance: Patient tolerated treatment well Patient left: in chair;with call bell/phone within reach;with chair alarm set    PT Visit Diagnosis: Muscle weakness (generalized) (M62.81);Difficulty in walking, not elsewhere classified (R26.2)     Time: 4010-2725 PT Time Calculation (min) (ACUTE ONLY): 21 min  Charges:  $Therapeutic Exercise: 8-22 mins                     Craig Bauer PT, Craig [facility]auer Acute Rehabilitation Services Pager: [phone] Office: [phone]  Craig Bauer Craig Bauer 09/30/2020, 3:22 PM

## 2020-09-30 NOTE — Plan of Care (Signed)
  Problem: Pain Management: Goal: Pain level will decrease with appropriate interventions Outcome: Progressing   Problem: Activity: Goal: Risk for activity intolerance will decrease Outcome: Progressing   

## 2020-09-30 NOTE — Progress Notes (Signed)
Physical Therapy Treatment Patient Details Name: Craig Bauer MRN: [id number] DOB: 1950-08-19 Today's Date: 09/30/2020   History of Present Illness Patient is 70 y.o. male s/p Rt TKA on 09/29/20 with PMH significant for OA, DM, bil TSA.    PT Comments    Pt eager to mobilize and practice stairs in order to d/c home today.  Spouse present and observed.  Gait belt also provided for taking home.  Pt had no further questions and feels ready for d/c home today.    Recommendations for follow up therapy are one component of a multi-disciplinary discharge planning process, led by the attending physician.  Recommendations may be updated based on patient status, additional functional criteria and insurance authorization.  Follow Up Recommendations  Follow surgeon's recommendation for DC plan and follow-up therapies     Equipment Recommendations  Rolling walker with 5" wheels    Recommendations for Other Services       Precautions / Restrictions Precautions Precautions: Fall;Knee Restrictions RLE Weight Bearing: Weight bearing as tolerated     Mobility  Bed Mobility Overal bed mobility: Needs Assistance Bed Mobility: Supine to Sit     Supine to sit: Supervision;HOB elevated     General bed mobility comments: pt OOB in recliner    Transfers Overall transfer level: Needs assistance Equipment used: Rolling walker (2 wheeled) Transfers: Sit to/from Stand Sit to Stand: Min guard         General transfer comment: verbal cues for UE and LE positioning  Ambulation/Gait Ambulation/Gait assistance: Min guard Gait Distance (Feet): 160 Feet Assistive device: Rolling walker (2 wheeled) Gait Pattern/deviations: Step-to pattern;Decreased weight shift to right;Antalgic Gait velocity: decr   General Gait Details: verbal cues for sequence, RW positioning, step length; required 2 standing rest breaks due to fatigue   Stairs Stairs: Yes Stairs assistance: Min guard Stair  Management: Step to pattern;Forwards;Two rails Number of Stairs: 3 General stair comments: verbal cues for safety and sequence; pt performed twice, utilized surgical leg as "good" LE second attempt due to significant left knee genu varum and pt reports more ease with this; spouse present and observed   Wheelchair Mobility    Modified Rankin (Stroke Patients Only)       Balance                                            Cognition Arousal/Alertness: Awake/alert Behavior During Therapy: WFL for tasks assessed/performed Overall Cognitive Status: Within Functional Limits for tasks assessed                                        Exercises    General Comments        Pertinent Vitals/Pain Pain Assessment: 0-10 Pain Score: 7  Pain Location: Rt knee Pain Descriptors / Indicators: Aching;Discomfort;Sore Pain Intervention(s): Repositioned;Monitored during session    Home Living                      Prior Function            PT Goals (current goals can now be found in the care plan section) Progress towards PT goals: Progressing toward goals    Frequency    7X/week      PT Plan Current plan remains appropriate  Co-evaluation              AM-PAC PT "6 Clicks" Mobility   Outcome Measure  Help needed turning from your back to your side while in a flat bed without using bedrails?: A Little Help needed moving from lying on your back to sitting on the side of a flat bed without using bedrails?: A Little Help needed moving to and from a bed to a chair (including a wheelchair)?: A Little Help needed standing up from a chair using your arms (e.g., wheelchair or bedside chair)?: A Little Help needed to walk in hospital room?: A Little Help needed climbing 3-5 steps with a railing? : A Little 6 Click Score: 18    End of Session Equipment Utilized During Treatment: Gait belt Activity Tolerance: Patient tolerated treatment  well Patient left: in chair;with call bell/phone within reach;with chair alarm set   PT Visit Diagnosis: Muscle weakness (generalized) (M62.81);Difficulty in walking, not elsewhere classified (R26.2)     Time: 1638-4536 PT Time Calculation (min) (ACUTE ONLY): 15 min  Charges:  $Gait Training: 8-22 mins           Thomasene Mohair PT, DPT Acute Rehabilitation Services Pager: [phone] Office: [phone]   [name] 09/30/2020, 3:35 PM

## 2020-10-04 ENCOUNTER — Encounter (HOSPITAL_COMMUNITY): Payer: Self-pay | Admitting: Orthopaedic Surgery

## 2021-02-10 ENCOUNTER — Other Ambulatory Visit: Payer: Self-pay | Admitting: Orthopaedic Surgery

## 2021-02-25 ENCOUNTER — Encounter (HOSPITAL_COMMUNITY): Payer: Medicare HMO

## 2021-03-09 ENCOUNTER — Ambulatory Visit: Admit: 2021-03-09 | Payer: Medicare HMO | Admitting: Orthopaedic Surgery

## 2021-03-09 SURGERY — ARTHROPLASTY, KNEE, TOTAL
Anesthesia: Spinal | Site: Knee | Laterality: Left

## 2021-03-29 ENCOUNTER — Other Ambulatory Visit: Payer: Self-pay | Admitting: Orthopaedic Surgery

## 2021-04-13 NOTE — Patient Instructions (Signed)
DUE TO COVID-19 ONLY ONE VISITOR  (aged 69 and older)  IS ALLOWED TO COME WITH YOU AND STAY IN THE WAITING ROOM ONLY DURING PRE OP AND PROCEDURE.   ?**NO VISITORS ARE ALLOWED IN THE SHORT STAY AREA OR RECOVERY ROOM!!** ? ?IF YOU WILL BE ADMITTED INTO THE HOSPITAL YOU ARE ALLOWED ONLY TWO SUPPORT PEOPLE DURING VISITATION HOURS ONLY (7 AM -8PM)   ?The support person(s) must pass our screening, gel in and out, and wear a mask at all times, including in the patient?s room. ?Patients must also wear a mask when staff or their support person are in the room. ?Visitors GUEST BADGE MUST BE WORN VISIBLY  ?One adult visitor may remain with you overnight and MUST be in the room by 8 P.M. ?  ? ? Your procedure is scheduled on: 04/27/21 ? ? Report to [facility] Main Entrance ? ?  Report to admitting at : 7:15 AM ? ? Call this number if you have problems the morning of surgery [phone] ? ? Do not eat food :After Midnight. ? ? After Midnight you may have the following liquids until: 7:00 AM DAY OF SURGERY ? ?Water ?Black Coffee (sugar ok, NO MILK/CREAM OR CREAMERS)  ?Tea (sugar ok, NO MILK/CREAM OR CREAMERS) regular and decaf                             ?Plain Jell-O (NO RED)                                           ?Fruit ices (not with fruit pulp, NO RED)                                     ?Popsicles (NO RED)                                                                  ?Juice: apple, WHITE grape, WHITE cranberry ?Sports drinks like Gatorade (NO RED) ?Clear broth(vegetable,chicken,beef) ? ?             ?Drink GATORADE drink AT: 7:00 AM the day of surgery.   ?  ?The day of surgery:  ?Drink ONE (1) Pre-Surgery Clear Ensure or G2 at AM the morning of surgery. Drink in one sitting. Do not sip.  ?This drink was given to you during your hospital  ?pre-op appointment visit. ?Nothing else to drink after completing the  ?Pre-Surgery Clear Ensure or G2. ?  ?       If you have questions, please contact your surgeon?s  office.  ?  ?Oral Hygiene is also important to reduce your risk of infection.                                    ?Remember - BRUSH YOUR TEETH THE MORNING OF SURGERY WITH YOUR REGULAR TOOTHPASTE ? ? Do NOT smoke after Midnight ? ? Take these medicines the morning of surgery with  A SIP OF WATER:  ? ?How to Manage Your Diabetes ?Before and After Surgery ? ?Why is it important to control my blood sugar before and after surgery? ?Improving blood sugar levels before and after surgery helps healing and can limit problems. ?A way of improving blood sugar control is eating a healthy diet by: ? Eating less sugar and carbohydrates ? Increasing activity/exercise ? Talking with your doctor about reaching your blood sugar goals ?High blood sugars (greater than 180 mg/dL) can raise your risk of infections and slow your recovery, so you will need to focus on controlling your diabetes during the weeks before surgery. ?Make sure that the doctor who takes care of your diabetes knows about your planned surgery including the date and location. ? ?How do I manage my blood sugar before surgery? ?Check your blood sugar at least 4 times a day, starting 2 days before surgery, to make sure that the level is not too high or low. ?Check your blood sugar the morning of your surgery when you wake up and every 2 hours until you get to the Short Stay unit. ?If your blood sugar is less than 70 mg/dL, you will need to treat for low blood sugar: ?Do not take insulin. ?Treat a low blood sugar (less than 70 mg/dL) with ? cup of clear juice (cranberry or apple), 4 glucose tablets, OR glucose gel. ?Recheck blood sugar in 15 minutes after treatment (to make sure it is greater than 70 mg/dL). If your blood sugar is not greater than 70 mg/dL on recheck, call [phone] for further instructions. ?Report your blood sugar to the short stay nurse when you get to Short Stay. ? ?If you are admitted to the hospital after surgery: ?Your blood sugar will be checked  by the staff and you will probably be given insulin after surgery (instead of oral diabetes medicines) to make sure you have good blood sugar levels. ?The goal for blood sugar control after surgery is 80-180 mg/dL. ? ? ?WHAT DO I DO ABOUT MY DIABETES MEDICATION? ? ?Do not take oral diabetes medicines (pills) the morning of surgery. ? ?THE NIGHT BEFORE SURGERY, take ONLY half of the lantus insulin dose.Take metformin and januvia as usual.     ? ?THE MORNING OF SURGERY, take ONLY half of the lantus insulin dose. DO NOT TAKE ANY ORAL DIABETIC MEDICATIONS DAY OF YOUR SURGERY ? ?The day of surgery, do not take other diabetes injectables, including Byetta (exenatide), Bydureon (exenatide ER), Victoza (liraglutide), or Trulicity (dulaglutide). ? ? ?Bring CPAP mask and tubing day of surgery. ?                  ?           You may not have any metal on your body including hair pins, jewelry, and body piercing ? ?           Do not wear make-up, lotions, powders, perfumes/cologne, or deodorant ? ?            Men may shave face and neck. ? ? Do not bring valuables to the hospital.  IS NOT ?            RESPONSIBLE   FOR VALUABLES. ? ? Contacts, dentures or bridgework may not be worn into surgery. ? ? Bring small overnight bag day of surgery. ?  ? Patients discharged on the day of surgery will not be allowed to drive home.  Someone NEEDS to stay with you  for the first 24 hours after anesthesia. ? ? Special Instructions: Bring a copy of your healthcare power of attorney and living will documents the day of surgery if you haven't scanned them before. ? ?            Please read over the following fact sheets you were given: IF YOU HAVE QUESTIONS ABOUT YOUR PRE-OP INSTRUCTIONS PLEASE CALL [phone] ? ?   [place] - Preparing for Surgery ?Before surgery, you can play an important role.  Because skin is not sterile, your skin needs to be as free of germs as possible.  You can reduce the number of germs on your skin by  washing with CHG (chlorahexidine gluconate) soap before surgery.  CHG is an antiseptic cleaner which kills germs and bonds with the skin to continue killing germs even after washing. ?Please DO NOT use if you have an allergy to CHG or antibacterial soaps.  If your skin becomes reddened/irritated stop using the CHG and inform your nurse when you arrive at Short Stay. ?Do not shave (including legs and underarms) for at least 48 hours prior to the first CHG shower.  You may shave your face/neck. ?Please follow these instructions carefully: ? 1.  Shower with CHG Soap the night before surgery and the  morning of Surgery. ? 2.  If you choose to wash your hair, wash your hair first as usual with your  normal  shampoo. ? 3.  After you shampoo, rinse your hair and body thoroughly to remove the  shampoo.                           4.  Use CHG as you would any other liquid soap.  You can apply chg directly  to the skin and wash  ?                     Gently with a scrungie or clean washcloth. ? 5.  Apply the CHG Soap to your body ONLY FROM THE NECK DOWN.   Do not use on face/ open      ?                     Wound or open sores. Avoid contact with eyes, ears mouth and genitals (private parts).  ?                     Engineering geologist,  Genitals (private parts) with your normal soap. ?            6.  Wash thoroughly, paying special attention to the area where your surgery  will be performed. ? 7.  Thoroughly rinse your body with warm water from the neck down. ? 8.  DO NOT shower/wash with your normal soap after using and rinsing off  the CHG Soap. ?               9.  Pat yourself dry with a clean towel. ?           10.  Wear clean pajamas. ?           11.  Place clean sheets on your bed the night of your first shower and do not  sleep with pets. ?Day of Surgery : ?Do not apply any lotions/deodorants the morning of surgery.  Please wear clean clothes to the hospital/surgery center. ? ?FAILURE TO FOLLOW THESE INSTRUCTIONS MAY  RESULT IN THE  CANCELLATION OF YOUR SURGERY ?PATIENT SIGNATURE_________________________________ ? ?NURSE SIGNATURE__________________________________ ? ?__________________________________________________________________

## 2021-04-14 ENCOUNTER — Encounter (HOSPITAL_COMMUNITY)
Admission: RE | Admit: 2021-04-14 | Discharge: 2021-04-14 | Disposition: A | Discharge status: Home / Self Care | Payer: Medicare HMO | Source: Ambulatory Visit | Attending: Orthopaedic Surgery | Admitting: Orthopaedic Surgery

## 2021-04-14 ENCOUNTER — Encounter (HOSPITAL_COMMUNITY): Payer: Self-pay

## 2021-04-14 ENCOUNTER — Other Ambulatory Visit: Payer: Self-pay

## 2021-04-14 VITALS — BP 130/70 | HR 78 | Temp 97.7°F | Resp 17 | Ht 67.5 in | Wt 225.0 lb

## 2021-04-14 DIAGNOSIS — Z87891 Personal history of nicotine dependence: Secondary | ICD-10-CM | POA: Diagnosis not present

## 2021-04-14 DIAGNOSIS — M1712 Unilateral primary osteoarthritis, left knee: Secondary | ICD-10-CM | POA: Insufficient documentation

## 2021-04-14 DIAGNOSIS — E119 Type 2 diabetes mellitus without complications: Secondary | ICD-10-CM

## 2021-04-14 DIAGNOSIS — E1165 Type 2 diabetes mellitus with hyperglycemia: Secondary | ICD-10-CM | POA: Diagnosis not present

## 2021-04-14 DIAGNOSIS — Z01812 Encounter for preprocedural laboratory examination: Secondary | ICD-10-CM | POA: Insufficient documentation

## 2021-04-14 DIAGNOSIS — G4730 Sleep apnea, unspecified: Secondary | ICD-10-CM | POA: Diagnosis not present

## 2021-04-14 DIAGNOSIS — Z794 Long term (current) use of insulin: Secondary | ICD-10-CM | POA: Insufficient documentation

## 2021-04-14 DIAGNOSIS — Z01818 Encounter for other preprocedural examination: Secondary | ICD-10-CM

## 2021-04-14 HISTORY — DX: Essential (primary) hypertension: I10

## 2021-04-14 HISTORY — DX: Chronic obstructive pulmonary disease, unspecified: J44.9

## 2021-04-14 LAB — CBC WITH DIFFERENTIAL/PLATELET
Abs Immature Granulocytes: 0.01 K/uL (ref 0.00–0.07)
Basophils Absolute: 0 K/uL (ref 0.0–0.1)
Basophils Relative: 1 %
Eosinophils Absolute: 0.1 K/uL (ref 0.0–0.5)
Eosinophils Relative: 2 %
HCT: 49.6 % (ref 39.0–52.0)
Hemoglobin: 16.5 g/dL (ref 13.0–17.0)
Immature Granulocytes: 0 %
Lymphocytes Relative: 27 %
Lymphs Abs: 2.2 K/uL (ref 0.7–4.0)
MCH: 29.6 pg (ref 26.0–34.0)
MCHC: 33.3 g/dL (ref 30.0–36.0)
MCV: 88.9 fL (ref 80.0–100.0)
Monocytes Absolute: 0.8 K/uL (ref 0.1–1.0)
Monocytes Relative: 9 %
Neutro Abs: 5.2 K/uL (ref 1.7–7.7)
Neutrophils Relative %: 61 %
Platelets: 267 K/uL (ref 150–400)
RBC: 5.58 MIL/uL (ref 4.22–5.81)
RDW: 14.7 % (ref 11.5–15.5)
WBC: 8.3 K/uL (ref 4.0–10.5)
nRBC: 0 % (ref 0.0–0.2)

## 2021-04-14 LAB — URINALYSIS, ROUTINE W REFLEX MICROSCOPIC
Bacteria, UA: NONE SEEN
Bilirubin Urine: NEGATIVE
Glucose, UA: >=500 mg/dL — AB
Ketones, ur: NEGATIVE mg/dL
Leukocytes,Ua: NEGATIVE
Nitrite: NEGATIVE
Protein, ur: NEGATIVE mg/dL
Specific Gravity, Urine: 1.018 (ref 1.005–1.030)
pH: 5 (ref 5.0–8.0)

## 2021-04-14 LAB — TYPE AND SCREEN
ABO/RH(D): A NEG
Antibody Screen: NEGATIVE

## 2021-04-14 LAB — BASIC METABOLIC PANEL WITH GFR
Anion gap: 5 (ref 5–15)
BUN: 16 mg/dL (ref 8–23)
CO2: 23 mmol/L (ref 22–32)
Calcium: 9.2 mg/dL (ref 8.9–10.3)
Chloride: 109 mmol/L (ref 98–111)
Creatinine, Ser: 0.9 mg/dL (ref 0.61–1.24)
GFR, Estimated: >60 mL/min
Glucose, Bld: 157 mg/dL — ABNORMAL HIGH (ref 70–99)
Potassium: 4.2 mmol/L (ref 3.5–5.1)
Sodium: 137 mmol/L (ref 135–145)

## 2021-04-14 LAB — SURGICAL PCR SCREEN
MRSA, PCR: NEGATIVE
Staphylococcus aureus: NEGATIVE

## 2021-04-14 LAB — GLUCOSE, CAPILLARY: Glucose-Capillary: 155 mg/dL — ABNORMAL HIGH (ref 70–99)

## 2021-04-14 NOTE — Progress Notes (Signed)
For Short Stay: ?[facility] appointment date: ?Date of COVID positive in last 90 days: ? ?Bowel Prep reminder: ? ? ?For Anesthesia: ?PCP - PA-C: Brien Mates ?Cardiologist -  ? ?Chest x-ray - 09/22/20 ?EKG - 09/22/20 ?Stress Test -  ?ECHO -  ?Cardiac Cath -  ?Pacemaker/ICD device last checked: ?Pacemaker orders received: ?Device Rep notified: ? ?Spinal Cord Stimulator: ? ?Sleep Study - Yes ?CPAP - NO ? ?Fasting Blood Sugar - 200's ?Checks Blood Sugar __3___ times a day ?Date and result of last Hgb A1c- 9.7: 01/21/21 ? ?Blood Thinner Instructions:Xarelto: No instructions yet as per pt. And his wife.They will call surgeon for instructions. ?Aspirin Instructions: ?Last Dose: ? ?Activity level: Can go up a flight of stairs and activities of daily living without stopping and without chest pain and/or shortness of breath ?  Able to exercise without chest pain and/or shortness of breath ?  Unable to go up a flight of stairs without chest pain and/or shortness of breath ?   ? ?Anesthesia review: Hx: Blood clot,DIA,OSA(NO CPAP) ? ?Patient denies shortness of breath, fever, cough and chest pain at PAT appointment ? ? ?Patient verbalized understanding of instructions that were given to them at the PAT appointment. Patient was also instructed that they will need to review over the PAT instructions again at home before surgery.  ?

## 2021-04-15 LAB — HEMOGLOBIN A1C
Hgb A1c MFr Bld: 9.4 % — ABNORMAL HIGH (ref 4.8–5.6)
Mean Plasma Glucose: 223 mg/dL

## 2021-04-15 NOTE — Progress Notes (Addendum)
Anesthesia Chart Review ? ? Case: [id number] Date/Time: 04/27/21 0947  ? Procedure: LEFT TOTAL KNEE ARTHROPLASTY (Left: Knee)  ? Anesthesia type: Spinal  ? Pre-op diagnosis: LEFT KNEE DEGENERATIVE JOINT DISEASE  ? Location: [facility] ROOM 07 / [facility]  ? Surgeons: Marcene Corning, MD  ? ?  ?Addendum:  Lurena Joiner from Dr. Nolon Nations office returned call - he is aware of pt's uncontrolled DM and wishes to proceed with surgery.  ? ?If glucose acceptable day of surgery, I anticipate pt can proceed with surgery as scheduled. ? ?Rica Mast, PhD, FNP-BC ?[facility]/Anesthesiology ?Phone: [phone] ?04/19/2021 2:26 PM  ? ? ? ?DISCUSSION:71 y.o. former smoker with h/o HTN, sleep apnea, COPD, DM II, left knee djd scheduled for above procedure 04/27/2021 with Dr. Marcene Corning.  ? ?Poorly controlled diabetes.  A1C 9.4. Discussed with Dr. Nolon Nations office.  ?VS: BP 130/70 (BP Location: Right Arm)   Pulse 78   Temp 36.5 ?C (Oral)   Resp 17   Ht 5' 7.5" (1.715 m)   Wt 102.1 kg   SpO2 97%   BMI 34.72 kg/m?  ? ?PROVIDERS: ?Courtney Heys, PA-C is PCP (notes in care everywhere)  ?  ? ?LABS:  forwarded to surgeon and PCP  ?(all labs ordered are listed, but only abnormal results are displayed) ? ?Labs Reviewed  ?BASIC METABOLIC PANEL - Abnormal; Notable for the following components:  ?    Result Value  ? Glucose, Bld 157 (*)   ? All other components within normal limits  ?URINALYSIS, ROUTINE W REFLEX MICROSCOPIC - Abnormal; Notable for the following components:  ? Glucose, UA >=500 (*)   ? Hgb urine dipstick MODERATE (*)   ? All other components within normal limits  ?GLUCOSE, CAPILLARY - Abnormal; Notable for the following components:  ? Glucose-Capillary 155 (*)   ? All other components within normal limits  ?HEMOGLOBIN A1C - Abnormal; Notable for the following components:  ? Hgb A1c MFr Bld 9.4 (*)   ? All other components within normal limits  ?SURGICAL PCR SCREEN  ?CBC WITH DIFFERENTIAL/PLATELET   ?TYPE AND SCREEN  ? ? ? ?IMAGES: ?CXR 09/22/20:  ?- Similar appearance of the chest x-ray to comparisons, with favored chronic changes and no evidence of acute cardiopulmonary disease ? ? ?EKG: ?09/22/2020 ?Rate 63 bpm  ?Sinus rhythm with 1st degree A-V block ?Left anterior fascicular block ?Minimal voltage criteria for LVH, may be normal variant ( [employer] product ) ?Cannot rule out Anterior infarct , age undetermined ?Abnormal ECG ? ?CV: ?Echo 06/22/18 (care everywhere):  ?- Normal LV systolic function.  ?- Mild LVH.  ?- Diastolic dysfunction, stage 1.  ?- No significant valvular disease.  ?- No evidence of valvular vegetation.  ? ? ?Past Medical History:  ?Diagnosis Date  ? Arthritis   ? Blood clot in vein   ? COPD (chronic obstructive pulmonary disease) (HCC)   ? Diabetes mellitus without complication (HCC)   ? Headache   ? migraine  ? Hypertension   ? Sleep apnea   ? ? ?Past Surgical History:  ?Procedure Laterality Date  ? HAND SURGERY Left   ? HERNIA REPAIR    ? SHOULDER ARTHROSCOPY W/ ROTATOR CUFF REPAIR Bilateral   ? TOTAL KNEE ARTHROPLASTY Right 09/29/2020  ? Procedure: RIGHT TOTAL KNEE ARTHROPLASTY;  Surgeon: Marcene Corning, MD;  Location: [facility];  Service: Orthopedics;  Laterality: Right;  ? TOTAL SHOULDER ARTHROPLASTY Bilateral   ? ? ?MEDICATIONS: ? albuterol (VENTOLIN HFA) 108 (90 Base)  MCG/ACT inhaler  ? cyclobenzaprine (FLEXERIL) 10 MG tablet  ? dapagliflozin propanediol (FARXIGA) 5 MG TABS tablet  ? diclofenac Sodium (VOLTAREN) 1 % GEL  ? DULoxetine (CYMBALTA) 60 MG capsule  ? furosemide (LASIX) 40 MG tablet  ? gabapentin (NEURONTIN) 800 MG tablet  ? insulin glargine (LANTUS SOLOSTAR) 100 UNIT/ML Solostar Pen  ? Lidocaine 2 % GEL  ? metFORMIN (GLUCOPHAGE) 500 MG tablet  ? mirtazapine (REMERON) 30 MG tablet  ? mupirocin ointment (BACTROBAN) 2 %  ? naproxen sodium (ALEVE) 220 MG tablet  ? omeprazole (PRILOSEC) 40 MG capsule  ? Oxycodone HCl 20 MG TABS  ? potassium chloride SA (KLOR-CON) 20 MEQ tablet  ?  pravastatin (PRAVACHOL) 40 MG tablet  ? rivaroxaban (XARELTO) 20 MG TABS tablet  ? rOPINIRole (REQUIP) 2 MG tablet  ? sitaGLIPtin (JANUVIA) 100 MG tablet  ? SUMAtriptan (IMITREX) 100 MG tablet  ? tirzepatide (MOUNJARO) 5 MG/0.5ML Pen  ? topiramate (TOPAMAX) 200 MG tablet  ? XTAMPZA ER 36 MG C12A  ? ?No current facility-administered medications for this encounter.  ? ? ? ?Jodell Cipro Ward, PA-C ?[facility] ?[phone] ? ? ? ? ? ?

## 2021-04-15 NOTE — Progress Notes (Signed)
Labs results: A1-C: 9.4 ?UA: >= 500 glucose  ?Hgb urine: moderate ?

## 2021-04-19 ENCOUNTER — Encounter (HOSPITAL_COMMUNITY): Payer: Self-pay

## 2021-04-19 NOTE — Anesthesia Preprocedure Evaluation (Deleted)
Anesthesia Evaluation  ? ? ?Airway ? ? ? ? ? ? ? Dental ?  ?Pulmonary ?former smoker,  ?  ? ? ? ? ? ? ? Cardiovascular ?hypertension,  ? ? ?  ?Neuro/Psych ?  ? GI/Hepatic ?  ?Endo/Other  ?diabetes ? Renal/GU ?  ? ?  ?Musculoskeletal ? ? Abdominal ?  ?Peds ? Hematology ?  ?Anesthesia Other Findings ? ? Reproductive/Obstetrics ? ?  ? ? ? ? ? ? ? ? ? ? ? ? ? ?  ?  ? ? ? ? ? ? ? ? ?Anesthesia Physical ?Anesthesia Plan ? ?ASA:  ? ?Anesthesia Plan:   ? ?Post-op Pain Management:   ? ?Induction:  ? ?PONV Risk Score and Plan:  ? ?Airway Management Planned:  ? ?Additional Equipment:  ? ?Intra-op Plan:  ? ?Post-operative Plan:  ? ?Informed Consent:  ? ?Plan Discussed with:  ? ?Anesthesia Plan Comments: (See APP note by AJonah Blue, FNP/Jessica Ward, PA)  ? ? ? ? ? ? ?Anesthesia Quick Evaluation ? ?

## 2021-04-26 NOTE — H&P (Signed)
TOTAL KNEE ADMISSION H&P ? ?Patient is being admitted for left total knee arthroplasty. ? ?Subjective: ? ?Chief Complaint:left knee pain. ? ?HPI: Craig Bauer, 71 y.o. male, has a history of pain and functional disability in the left knee due to arthritis and has failed non-surgical conservative treatments for greater than 12 weeks to includeNSAID's and/or analgesics, corticosteriod injections, viscosupplementation injections, flexibility and strengthening excercises, supervised PT with diminished ADL's post treatment, use of assistive devices, weight reduction as appropriate, and activity modification.  Onset of symptoms was gradual, starting 5 years ago with gradually worsening course since that time. The patient noted no past surgery on the left knee(s).  Patient currently rates pain in the left knee(s) at 10 out of 10 with activity. Patient has night pain, worsening of pain with activity and weight bearing, pain that interferes with activities of daily living, crepitus, and joint swelling.  Patient has evidence of subchondral cysts, subchondral sclerosis, periarticular osteophytes, and joint space narrowing by imaging studies. There is no active infection. ? ?Patient Active Problem List  ? Diagnosis Date Noted  ? Primary localized osteoarthritis of right knee 09/29/2020  ? Primary osteoarthritis of right knee 09/29/2020  ? ?Past Medical History:  ?Diagnosis Date  ? Arthritis   ? Blood clot in vein   ? COPD (chronic obstructive pulmonary disease) (HCC)   ? Diabetes mellitus without complication (HCC)   ? Headache   ? migraine  ? Hypertension   ? Sleep apnea   ?  ?Past Surgical History:  ?Procedure Laterality Date  ? HAND SURGERY Left   ? HERNIA REPAIR    ? SHOULDER ARTHROSCOPY W/ ROTATOR CUFF REPAIR Bilateral   ? TOTAL KNEE ARTHROPLASTY Right 09/29/2020  ? Procedure: RIGHT TOTAL KNEE ARTHROPLASTY;  Surgeon: Marcene Corning, MD;  Location: [facility];  Service: Orthopedics;  Laterality: Right;  ? TOTAL SHOULDER  ARTHROPLASTY Bilateral   ?  ?No current facility-administered medications for this encounter.  ? ?Current Outpatient Medications  ?Medication Sig Dispense Refill Last Dose  ? albuterol (VENTOLIN HFA) 108 (90 Base) MCG/ACT inhaler Inhale 2 puffs into the lungs every 6 (six) hours as needed for wheezing or shortness of breath.     ? cyclobenzaprine (FLEXERIL) 10 MG tablet Take 10 mg by mouth daily as needed for muscle spasms.     ? dapagliflozin propanediol (FARXIGA) 5 MG TABS tablet Take 5 mg by mouth daily.     ? diclofenac Sodium (VOLTAREN) 1 % GEL Apply 1 application topically 4 (four) times daily as needed (pain).     ? DULoxetine (CYMBALTA) 60 MG capsule Take 60 mg by mouth daily.     ? furosemide (LASIX) 40 MG tablet Take 80 mg by mouth 2 (two) times daily.     ? gabapentin (NEURONTIN) 800 MG tablet Take 800 mg by mouth 3 (three) times daily.     ? insulin glargine (LANTUS SOLOSTAR) 100 UNIT/ML Solostar Pen Inject 26 Units into the skin at bedtime.     ? Lidocaine 2 % GEL Apply 1 application topically daily as needed (pain).     ? metFORMIN (GLUCOPHAGE) 500 MG tablet Take 500 mg by mouth 2 (two) times daily.     ? mirtazapine (REMERON) 30 MG tablet Take 45 mg by mouth at bedtime.     ? mupirocin ointment (BACTROBAN) 2 % Apply 1 application. topically 3 (three) times daily.     ? naproxen sodium (ALEVE) 220 MG tablet Take 660 mg by mouth 2 (two) times daily as  needed (pain).     ? omeprazole (PRILOSEC) 40 MG capsule Take 40 mg by mouth daily.     ? Oxycodone HCl 20 MG TABS Take 20 mg by mouth 4 (four) times daily as needed (pain).     ? potassium chloride SA (KLOR-CON) 20 MEQ tablet Take 40 mEq by mouth 3 (three) times daily.     ? pravastatin (PRAVACHOL) 40 MG tablet Take 40 mg by mouth at bedtime.     ? rivaroxaban (XARELTO) 20 MG TABS tablet Take 20 mg by mouth daily.     ? rOPINIRole (REQUIP) 2 MG tablet Take 2 mg by mouth at bedtime.     ? sitaGLIPtin (JANUVIA) 100 MG tablet Take 100 mg by mouth daily.      ? SUMAtriptan (IMITREX) 100 MG tablet Take 100 mg by mouth every 2 (two) hours as needed for migraine. May repeat in 2 hours if headache persists or recurs.     ? tirzepatide [facility](MOUNJARO) 5 MG/0.5ML Pen Inject 5 mg into the skin every Friday.     ? topiramate (TOPAMAX) 200 MG tablet Take 200-400 mg by mouth See admin instructions. Take 200 mg in the morning and 400 mg at bedtime     ? XTAMPZA ER 36 MG C12A Take 36 mg by mouth every 8 (eight) hours.     ? ?Allergies  ?Allergen Reactions  ? Contrast Media [Iodinated Contrast Media] Anaphylaxis  ? Ciprofloxacin Hives  ? Latex Hives  ? Penicillins Hives  ?  ?Social History  ? ?Tobacco Use  ? Smoking status: Former  ?  Types: Cigarettes  ?  Quit date: 09/17/2020  ?  Years since quitting: 0.6  ? Smokeless tobacco: Never  ?Substance Use Topics  ? Alcohol use: Not Currently  ?  ?No family history on file.  ? ?Review of Systems  ?Musculoskeletal:  Positive for arthralgias.  ?     Left knee  ?All other systems reviewed and are negative. ? ?Objective: ? ?Physical Exam ?Constitutional:   ?   Appearance: Normal appearance.  ?HENT:  ?   Head: Normocephalic and atraumatic.  ?   Nose: Nose normal.  ?   Mouth/Throat:  ?   Pharynx: Oropharynx is clear.  ?Eyes:  ?   Extraocular Movements: Extraocular movements intact.  ?Cardiovascular:  ?   Rate and Rhythm: Normal rate.  ?Pulmonary:  ?   Effort: Pulmonary effort is normal.  ?Abdominal:  ?   Palpations: Abdomen is soft.  ?Musculoskeletal:  ?   Comments: Left knee shows a moderate to advanced varus deformity.  Range of motion about 5-100? of flexion.  Tenderness to palpation mostly medially.  His calf is soft and nontender.  He is neurovascularly intact distally.    ?Skin: ?   General: Skin is warm and dry.  ?Neurological:  ?   General: No focal deficit present.  ?   Mental Status: He is alert and oriented to person, place, and time.  ?Psychiatric:     ?   Mood and Affect: Mood normal.     ?   Behavior: Behavior normal.     ?   Thought  Content: Thought content normal.     ?   Judgment: Judgment normal.  ? ? ?Vital signs in last 24 hours: ?  ? ?Labs: ? ? ?Estimated body mass index is 34.72 kg/m? as calculated from the following: ?  Height as of 04/14/21: 5' 7.5" (1.715 m). ?  Weight as of 04/14/21: 102.1  kg. ? ? ?Imaging Review ?Plain radiographs demonstrate severe degenerative joint disease of the left knee(s). The overall alignment isneutral. The bone quality appears to be good for age and reported activity level. ? ? ? ? ? ?Assessment/Plan: ? ?End stage primary arthritis, left knee  ? ?The patient history, physical examination, clinical judgment of the provider and imaging studies are consistent with end stage degenerative joint disease of the left knee(s) and total knee arthroplasty is deemed medically necessary. The treatment options including medical management, injection therapy arthroscopy and arthroplasty were discussed at length. The risks and benefits of total knee arthroplasty were presented and reviewed. The risks due to aseptic loosening, infection, stiffness, patella tracking problems, thromboembolic complications and other imponderables were discussed. The patient acknowledged the explanation, agreed to proceed with the plan and consent was signed. Patient is being admitted for inpatient treatment for surgery, pain control, PT, OT, prophylactic antibiotics, VTE prophylaxis, progressive ambulation and ADL's and discharge planning. The patient is planning to be discharged home with home health services ? ?

## 2021-04-26 NOTE — Anesthesia Preprocedure Evaluation (Addendum)
Anesthesia Evaluation  ?Patient identified by MRN, date of birth, ID band ?Patient awake ? ? ? ?Reviewed: ?Allergy & Precautions, NPO status , Patient's Chart, lab work & pertinent test results ? ?History of Anesthesia Complications ?Negative for: history of anesthetic complications ? ?Airway ?Mallampati: I ? ?TM Distance: >3 FB ?Neck ROM: Full ? ? ? Dental ? ?(+) Edentulous Lower, Edentulous Upper ?  ?Pulmonary ?sleep apnea , COPD,  COPD inhaler, Patient abstained from smoking., former smoker,  ?  ?Pulmonary exam normal ? ? ? ? ? ? ? Cardiovascular ?hypertension, Pt. on medications ?+ DVT  ?Normal cardiovascular exam ? ?Echo 06/22/18 (care everywhere):  ?- Normal LV systolic function.  ?- Mild LVH.  ?- Diastolic dysfunction, stage 1.  ?- No significant valvular disease.  ?- No evidence of valvular vegetation.  ?? ?  ?Neuro/Psych ? Headaches, Seizures -, Well Controlled,  negative psych ROS  ? GI/Hepatic ?negative GI ROS, (+)  ?  ? substance abuse ? ,   ?Endo/Other  ?diabetes, Type 2, Oral Hypoglycemic Agents ? Renal/GU ?negative Renal ROS  ? ?  ?Musculoskeletal ? ?(+) Arthritis , narcotic dependent ? Abdominal ?  ?Peds ? Hematology ? ?Plt 166k ?On xarelto, last dose 5 days ago ? ?   ?Anesthesia Other Findings ?Surgery previously cancelled for poorly controlled DM, now improved per A1C - see PAT note ? ? Reproductive/Obstetrics ? ?  ? ? ? ? ? ? ? ? ? ? ? ? ? ?  ?  ? ? ? ? ? ? ? ?Anesthesia Physical ? ?Anesthesia Plan ? ?ASA: 3 ? ?Anesthesia Plan: Spinal and MAC  ? ?Post-op Pain Management:  Regional for Post-op pain and Regional block*, Celebrex PO (pre-op)* and Tylenol PO (pre-op)*  ? ?Induction:  ? ?PONV Risk Score and Plan: 2 and Treatment may vary due to age or medical condition, Propofol infusion and Ondansetron ? ?Airway Management Planned: Natural Airway and Simple Face Mask ? ?Additional Equipment: None ? ?Intra-op Plan:  ? ?Post-operative Plan:  ? ?Informed Consent: I have  reviewed the patients History and Physical, chart, labs and discussed the procedure including the risks, benefits and alternatives for the proposed anesthesia with the patient or authorized representative who has indicated his/her understanding and acceptance.  ? ? ? ?Dental advisory given ? ?Plan Discussed with: Anesthesiologist and CRNA ? ?Anesthesia Plan Comments:   ? ? ? ? ? ?Anesthesia Quick Evaluation ? ?

## 2021-04-27 ENCOUNTER — Ambulatory Visit (HOSPITAL_COMMUNITY): Payer: Medicare HMO | Admitting: Physician Assistant

## 2021-04-27 ENCOUNTER — Ambulatory Visit (HOSPITAL_COMMUNITY)
Admission: RE | Admit: 2021-04-27 | Discharge: 2021-04-27 | Disposition: A | Discharge status: Home / Self Care | Payer: Medicare HMO | Attending: Orthopaedic Surgery | Admitting: Orthopaedic Surgery

## 2021-04-27 ENCOUNTER — Ambulatory Visit (HOSPITAL_COMMUNITY): Payer: Medicare HMO

## 2021-04-27 ENCOUNTER — Encounter (HOSPITAL_COMMUNITY)
Admission: RE | Disposition: A | Discharge status: Home / Self Care | Payer: Self-pay | Source: Home / Self Care | Attending: Orthopaedic Surgery

## 2021-04-27 ENCOUNTER — Other Ambulatory Visit: Payer: Self-pay

## 2021-04-27 ENCOUNTER — Encounter (HOSPITAL_COMMUNITY): Payer: Self-pay | Admitting: Orthopaedic Surgery

## 2021-04-27 ENCOUNTER — Ambulatory Visit (HOSPITAL_BASED_OUTPATIENT_CLINIC_OR_DEPARTMENT_OTHER): Payer: Medicare HMO | Admitting: Anesthesiology

## 2021-04-27 DIAGNOSIS — I10 Essential (primary) hypertension: Secondary | ICD-10-CM

## 2021-04-27 DIAGNOSIS — Z87891 Personal history of nicotine dependence: Secondary | ICD-10-CM | POA: Diagnosis not present

## 2021-04-27 DIAGNOSIS — Z791 Long term (current) use of non-steroidal anti-inflammatories (NSAID): Secondary | ICD-10-CM | POA: Diagnosis not present

## 2021-04-27 DIAGNOSIS — Z7984 Long term (current) use of oral hypoglycemic drugs: Secondary | ICD-10-CM | POA: Insufficient documentation

## 2021-04-27 DIAGNOSIS — Z794 Long term (current) use of insulin: Secondary | ICD-10-CM | POA: Diagnosis not present

## 2021-04-27 DIAGNOSIS — J449 Chronic obstructive pulmonary disease, unspecified: Secondary | ICD-10-CM | POA: Diagnosis not present

## 2021-04-27 DIAGNOSIS — Z7901 Long term (current) use of anticoagulants: Secondary | ICD-10-CM | POA: Diagnosis not present

## 2021-04-27 DIAGNOSIS — M1712 Unilateral primary osteoarthritis, left knee: Secondary | ICD-10-CM | POA: Diagnosis present

## 2021-04-27 DIAGNOSIS — G4730 Sleep apnea, unspecified: Secondary | ICD-10-CM | POA: Diagnosis not present

## 2021-04-27 DIAGNOSIS — I82409 Acute embolism and thrombosis of unspecified deep veins of unspecified lower extremity: Secondary | ICD-10-CM | POA: Diagnosis not present

## 2021-04-27 DIAGNOSIS — E119 Type 2 diabetes mellitus without complications: Secondary | ICD-10-CM | POA: Insufficient documentation

## 2021-04-27 DIAGNOSIS — Z79899 Other long term (current) drug therapy: Secondary | ICD-10-CM | POA: Diagnosis not present

## 2021-04-27 HISTORY — PX: TOTAL KNEE ARTHROPLASTY: SHX125

## 2021-04-27 LAB — GLUCOSE, CAPILLARY
Glucose-Capillary: 186 mg/dL — ABNORMAL HIGH (ref 70–99)
Glucose-Capillary: 159 mg/dL — ABNORMAL HIGH (ref 70–99)

## 2021-04-27 SURGERY — ARTHROPLASTY, KNEE, TOTAL
Anesthesia: Monitor Anesthesia Care | Site: Knee | Laterality: Left

## 2021-04-27 MED ORDER — TRANEXAMIC ACID 1000 MG/10ML IV SOLN
2000.0000 mg | INTRAVENOUS | Status: DC
  Filled 2021-04-27: qty 20

## 2021-04-27 MED ORDER — METHOCARBAMOL 500 MG PO TABS: 500.0000 mg | ORAL_TABLET | Freq: Four times a day (QID) | ORAL | Status: DC | PRN

## 2021-04-27 MED ORDER — PHENYLEPHRINE HCL-NACL 20-0.9 MG/250ML-% IV SOLN
INTRAVENOUS | Status: DC | PRN
  Administered 2021-04-27: 40 ug/min via INTRAVENOUS

## 2021-04-27 MED ORDER — PROPOFOL 1000 MG/100ML IV EMUL
INTRAVENOUS | Status: AC
  Filled 2021-04-27: qty 200

## 2021-04-27 MED ORDER — OXYCODONE HCL 10 MG PO TABS
5.0000 mg | ORAL_TABLET | Freq: Three times a day (TID) | ORAL | 0 refills | Status: AC | PRN
Start: 2021-04-27 — End: 2022-04-27

## 2021-04-27 MED ORDER — SODIUM CHLORIDE 0.9 % IR SOLN
Status: DC | PRN
  Administered 2021-04-27: 3000 mL

## 2021-04-27 MED ORDER — LACTATED RINGERS IV SOLN: INTRAVENOUS | Status: DC

## 2021-04-27 MED ORDER — TRANEXAMIC ACID-NACL 1000-0.7 MG/100ML-% IV SOLN
1000.0000 mg | INTRAVENOUS | Status: AC
  Administered 2021-04-27: 1000 mg via INTRAVENOUS
  Filled 2021-04-27: qty 100

## 2021-04-27 MED ORDER — FENTANYL CITRATE PF 50 MCG/ML IJ SOSY: 25.0000 ug | PREFILLED_SYRINGE | INTRAMUSCULAR | Status: DC | PRN

## 2021-04-27 MED ORDER — BUPIVACAINE IN DEXTROSE 0.75-8.25 % IT SOLN
INTRATHECAL | Status: DC | PRN
  Administered 2021-04-27: 1.8 mL via INTRATHECAL

## 2021-04-27 MED ORDER — POVIDONE-IODINE 10 % EX SWAB: 2.0000 "application " | Freq: Once | CUTANEOUS | Status: DC

## 2021-04-27 MED ORDER — ORAL CARE MOUTH RINSE: 15.0000 mL | Freq: Once | OROMUCOSAL | Status: AC

## 2021-04-27 MED ORDER — BUPIVACAINE LIPOSOME 1.3 % IJ SUSP
INTRAMUSCULAR | Status: AC
  Filled 2021-04-27: qty 20

## 2021-04-27 MED ORDER — TRANEXAMIC ACID-NACL 1000-0.7 MG/100ML-% IV SOLN
1000.0000 mg | Freq: Once | INTRAVENOUS | Status: AC
  Administered 2021-04-27: 1000 mg via INTRAVENOUS

## 2021-04-27 MED ORDER — SUGAMMADEX SODIUM 500 MG/5ML IV SOLN
INTRAVENOUS | Status: AC
  Filled 2021-04-27: qty 5

## 2021-04-27 MED ORDER — AMISULPRIDE (ANTIEMETIC) 5 MG/2ML IV SOLN: 10.0000 mg | Freq: Once | INTRAVENOUS | Status: DC | PRN

## 2021-04-27 MED ORDER — VANCOMYCIN HCL 1500 MG/300ML IV SOLN
1500.0000 mg | INTRAVENOUS | Status: AC
  Administered 2021-04-27: 1500 mg via INTRAVENOUS
  Filled 2021-04-27: qty 300

## 2021-04-27 MED ORDER — BUPIVACAINE-EPINEPHRINE 0.5% -1:200000 IJ SOLN
INTRAMUSCULAR | Status: DC | PRN
  Administered 2021-04-27: 30 mL

## 2021-04-27 MED ORDER — TRANEXAMIC ACID 1000 MG/10ML IV SOLN
INTRAVENOUS | Status: DC | PRN
  Administered 2021-04-27: 2000 mg via TOPICAL

## 2021-04-27 MED ORDER — LACTATED RINGERS IV BOLUS
250.0000 mL | Freq: Once | INTRAVENOUS | Status: AC
  Administered 2021-04-27: 250 mL via INTRAVENOUS

## 2021-04-27 MED ORDER — CELECOXIB 200 MG PO CAPS
200.0000 mg | ORAL_CAPSULE | Freq: Once | ORAL | Status: AC
  Administered 2021-04-27: 200 mg via ORAL
  Filled 2021-04-27: qty 1

## 2021-04-27 MED ORDER — BUPIVACAINE LIPOSOME 1.3 % IJ SUSP
INTRAMUSCULAR | Status: DC | PRN
  Administered 2021-04-27: 20 mL

## 2021-04-27 MED ORDER — PROPOFOL 1000 MG/100ML IV EMUL
INTRAVENOUS | Status: AC
  Filled 2021-04-27: qty 100

## 2021-04-27 MED ORDER — CEFAZOLIN SODIUM-DEXTROSE 2-4 GM/100ML-% IV SOLN
INTRAVENOUS | Status: AC
  Filled 2021-04-27: qty 100

## 2021-04-27 MED ORDER — ONDANSETRON HCL 4 MG/2ML IJ SOLN
INTRAMUSCULAR | Status: AC
  Filled 2021-04-27: qty 2

## 2021-04-27 MED ORDER — CEFAZOLIN SODIUM-DEXTROSE 2-3 GM-%(50ML) IV SOLR
INTRAVENOUS | Status: DC | PRN
  Administered 2021-04-27: 2 g via INTRAVENOUS

## 2021-04-27 MED ORDER — DEXAMETHASONE SODIUM PHOSPHATE 10 MG/ML IJ SOLN
INTRAMUSCULAR | Status: DC | PRN
  Administered 2021-04-27: 5 mg

## 2021-04-27 MED ORDER — MIDAZOLAM HCL 2 MG/2ML IJ SOLN
1.0000 mg | INTRAMUSCULAR | Status: AC
  Administered 2021-04-27: 1 mg via INTRAVENOUS
  Filled 2021-04-27: qty 2

## 2021-04-27 MED ORDER — STERILE WATER FOR IRRIGATION IR SOLN
Status: DC | PRN
  Administered 2021-04-27: 2000 mL

## 2021-04-27 MED ORDER — CEFAZOLIN SODIUM 1 G IJ SOLR
INTRAMUSCULAR | Status: AC
  Filled 2021-04-27: qty 10

## 2021-04-27 MED ORDER — ACETAMINOPHEN 500 MG PO TABS
1000.0000 mg | ORAL_TABLET | Freq: Once | ORAL | Status: AC
Start: 2021-04-27 — End: 2021-04-27
  Administered 2021-04-27: 1000 mg via ORAL
  Filled 2021-04-27: qty 2

## 2021-04-27 MED ORDER — 0.9 % SODIUM CHLORIDE (POUR BTL) OPTIME
TOPICAL | Status: DC | PRN
  Administered 2021-04-27: 1000 mL

## 2021-04-27 MED ORDER — BUPIVACAINE-EPINEPHRINE (PF) 0.5% -1:200000 IJ SOLN
INTRAMUSCULAR | Status: AC
  Filled 2021-04-27: qty 30

## 2021-04-27 MED ORDER — PROPOFOL 500 MG/50ML IV EMUL
INTRAVENOUS | Status: DC | PRN
  Administered 2021-04-27: 100 ug/kg/min via INTRAVENOUS

## 2021-04-27 MED ORDER — SODIUM CHLORIDE (PF) 0.9 % IJ SOLN
INTRAMUSCULAR | Status: DC | PRN
  Administered 2021-04-27: 30 mL

## 2021-04-27 MED ORDER — BUPIVACAINE LIPOSOME 1.3 % IJ SUSP: 20.0000 mL | Freq: Once | INTRAMUSCULAR | Status: DC

## 2021-04-27 MED ORDER — CEFAZOLIN SODIUM-DEXTROSE 2-4 GM/100ML-% IV SOLN: 2.0000 g | Freq: Four times a day (QID) | INTRAVENOUS | Status: DC

## 2021-04-27 MED ORDER — CHLORHEXIDINE GLUCONATE 0.12 % MT SOLN
15.0000 mL | Freq: Once | OROMUCOSAL | Status: AC
  Administered 2021-04-27: 15 mL via OROMUCOSAL

## 2021-04-27 MED ORDER — METHOCARBAMOL 500 MG IVPB - SIMPLE MED
500.0000 mg | Freq: Four times a day (QID) | INTRAVENOUS | Status: DC | PRN
Start: 2021-04-27 — End: 2021-04-27

## 2021-04-27 MED ORDER — FENTANYL CITRATE PF 50 MCG/ML IJ SOSY
50.0000 ug | PREFILLED_SYRINGE | INTRAMUSCULAR | Status: AC
  Administered 2021-04-27: 50 ug via INTRAVENOUS
  Filled 2021-04-27: qty 2

## 2021-04-27 MED ORDER — ONDANSETRON HCL 4 MG/2ML IJ SOLN
INTRAMUSCULAR | Status: DC | PRN
Start: 2021-04-27 — End: 2021-04-27
  Administered 2021-04-27: 4 mg via INTRAVENOUS

## 2021-04-27 MED ORDER — LACTATED RINGERS IV BOLUS
500.0000 mL | Freq: Once | INTRAVENOUS | Status: AC
  Administered 2021-04-27: 500 mL via INTRAVENOUS

## 2021-04-27 MED ORDER — ROPIVACAINE HCL 7.5 MG/ML IJ SOLN
INTRAMUSCULAR | Status: DC | PRN
  Administered 2021-04-27: 20 mL via PERINEURAL

## 2021-04-27 MED ORDER — LACTATED RINGERS IV BOLUS: 250.0000 mL | Freq: Once | INTRAVENOUS | Status: DC

## 2021-04-27 MED ORDER — TRANEXAMIC ACID-NACL 1000-0.7 MG/100ML-% IV SOLN
INTRAVENOUS | Status: AC
  Filled 2021-04-27: qty 100

## 2021-04-27 MED ORDER — PROPOFOL 10 MG/ML IV BOLUS
INTRAVENOUS | Status: DC | PRN
  Administered 2021-04-27 (×2): 30 mg via INTRAVENOUS

## 2021-04-27 MED ORDER — SODIUM CHLORIDE (PF) 0.9 % IJ SOLN
INTRAMUSCULAR | Status: AC
  Filled 2021-04-27: qty 50

## 2021-04-27 SURGICAL SUPPLY — 56 items
ATTUNE MED DOME PAT 41 KNEE (Knees) ×1 IMPLANT
ATTUNE PS FEM LT SZ 8 CEM KNEE (Femur) ×1 IMPLANT
ATTUNE PSRP INSR SZ8 6 KNEE (Insert) ×1 IMPLANT
BAG COUNTER SPONGE SURGICOUNT (BAG) ×2 IMPLANT
BAG DECANTER FOR FLEXI CONT (MISCELLANEOUS) ×2 IMPLANT
BAG SPEC THK2 15X12 ZIP CLS (MISCELLANEOUS) ×1
BAG SPNG CNTER NS LX DISP (BAG) ×1
BAG ZIPLOCK 12X15 (MISCELLANEOUS) ×2 IMPLANT
BASE TIBIAL ROT PLAT SZ 8 KNEE (Knees) IMPLANT
BLADE SAGITTAL 25.0X1.19X90 (BLADE) ×2 IMPLANT
BLADE SAW SGTL 11.0X1.19X90.0M (BLADE) ×2 IMPLANT
BLADE SURG SZ10 CARB STEEL (BLADE) ×2 IMPLANT
BNDG ELASTIC 6X5.8 VLCR STR LF (GAUZE/BANDAGES/DRESSINGS) ×2 IMPLANT
BOOTIES KNEE HIGH SLOAN (MISCELLANEOUS) ×2 IMPLANT
BOWL SMART MIX CTS (DISPOSABLE) ×2 IMPLANT
BSPLAT TIB 8 CMNT ROT PLAT STR (Knees) ×1 IMPLANT
CEMENT HV SMART SET (Cement) ×4 IMPLANT
COVER SURGICAL LIGHT HANDLE (MISCELLANEOUS) ×2 IMPLANT
CUFF TOURN SGL QUICK 34 (TOURNIQUET CUFF) ×2
CUFF TRNQT CYL 34X4.125X (TOURNIQUET CUFF) ×1 IMPLANT
DRAPE INCISE IOBAN 66X45 STRL (DRAPES) ×2 IMPLANT
DRAPE ORTHO 2.5IN SPLIT 77X108 (DRAPES) ×2 IMPLANT
DRAPE ORTHO SPLIT 77X108 STRL (DRAPES) ×4
DRAPE SHEET LG 3/4 BI-LAMINATE (DRAPES) ×2 IMPLANT
DRAPE U-SHAPE 47X51 STRL (DRAPES) ×2 IMPLANT
DRSG AQUACEL AG ADV 3.5X10 (GAUZE/BANDAGES/DRESSINGS) ×2 IMPLANT
DURAPREP 26ML APPLICATOR (WOUND CARE) ×4 IMPLANT
ELECT REM PT RETURN 15FT ADLT (MISCELLANEOUS) ×2 IMPLANT
GLOVE BIO SURGEON STRL SZ8 (GLOVE) ×4 IMPLANT
GLOVE BIOGEL PI IND STRL 8 (GLOVE) ×2 IMPLANT
GLOVE BIOGEL PI INDICATOR 8 (GLOVE) ×2
GOWN STRL REUS W/ TWL XL LVL3 (GOWN DISPOSABLE) ×2 IMPLANT
GOWN STRL REUS W/TWL XL LVL3 (GOWN DISPOSABLE) ×4
HANDPIECE INTERPULSE COAX TIP (DISPOSABLE) ×2
HOLDER FOLEY CATH W/STRAP (MISCELLANEOUS) IMPLANT
HOOD PEEL AWAY FLYTE STAYCOOL (MISCELLANEOUS) ×6 IMPLANT
KIT TURNOVER KIT A (KITS) IMPLANT
MANIFOLD NEPTUNE II (INSTRUMENTS) ×2 IMPLANT
NEEDLE HYPO 22GX1.5 SAFETY (NEEDLE) ×2 IMPLANT
NS IRRIG 1000ML POUR BTL (IV SOLUTION) ×2 IMPLANT
PACK TOTAL KNEE CUSTOM (KITS) ×2 IMPLANT
PAD ARMBOARD 7.5X6 YLW CONV (MISCELLANEOUS) ×2 IMPLANT
PROTECTOR NERVE ULNAR (MISCELLANEOUS) ×2 IMPLANT
SET HNDPC FAN SPRY TIP SCT (DISPOSABLE) ×1 IMPLANT
SPIKE FLUID TRANSFER (MISCELLANEOUS) ×4 IMPLANT
SUT ETHIBOND NAB CT1 #1 30IN (SUTURE) ×4 IMPLANT
SUT VIC AB 0 CT1 36 (SUTURE) ×2 IMPLANT
SUT VIC AB 2-0 CT1 27 (SUTURE) ×2
SUT VIC AB 2-0 CT1 TAPERPNT 27 (SUTURE) ×1 IMPLANT
SUT VICRYL AB 3-0 FS1 BRD 27IN (SUTURE) ×2 IMPLANT
SUT VLOC 180 0 24IN GS25 (SUTURE) ×2 IMPLANT
TIBIAL BASE ROT PLAT SZ 8 KNEE (Knees) ×2 IMPLANT
TRAY FOLEY MTR SLVR 16FR STAT (SET/KITS/TRAYS/PACK) IMPLANT
WATER STERILE IRR 1000ML POUR (IV SOLUTION) ×2 IMPLANT
WRAP KNEE MAXI GEL POST OP (GAUZE/BANDAGES/DRESSINGS) ×2 IMPLANT
YANKAUER SUCT BULB TIP NO VENT (SUCTIONS) ×2 IMPLANT

## 2021-04-27 NOTE — Progress Notes (Signed)
Assisted Dr. James Singer with  Left Knee Adductor Canal block. Side rails up, monitors on throughout procedure. See vital signs in flow sheet. Tolerated Procedure well.  

## 2021-04-27 NOTE — Op Note (Signed)
PREOP DIAGNOSIS: DJD LEFT KNEE ?POSTOP DIAGNOSIS:  same ?PROCEDURE: LEFT TKR ?ANESTHESIA: Spinal and MAC ?ATTENDING SURGEON: Hessie Dibble ?ASSISTANT: Loni Dolly PA ? ?INDICATIONS FOR PROCEDURE: Craig Bauer is a 71 y.o. male who has struggled for a long time with pain due to degenerative arthritis of the left knee.  He has a moderate varus deformity and a flexion contracture. The patient has failed many conservative non-operative measures and at this point has pain which limits the ability to sleep and walk.  The patient is offered total knee replacement.  Informed operative consent was obtained after discussion of possible risks of anesthesia, infection, neurovascular injury, DVT, and death.  The importance of the post-operative rehabilitation protocol to optimize result was stressed extensively with the patient. ? ?SUMMARY OF FINDINGS AND PROCEDURE:  [name] was taken to the operative suite where under the above anesthesia a left knee replacement was performed.  There were advanced degenerative changes and the bone quality was excellent.  We did our best to correct his varus deformity and flexion contracture. We used the DePuy Attune system and placed size 8 femur, 8 tibia, 41 mm all polyethylene patella, and a size 6 mm spacer.  Loni Dolly PA-C assisted throughout and was invaluable to the completion of the case in that he helped retract and maintain exposure while I placed the components.  He also helped close thereby minimizing OR time.  The patient was admitted for appropriate post-op care to include perioperative antibiotics and mechanical and pharmacologic measures for DVT prophylaxis. ? ?DESCRIPTION OF PROCEDURE:  [name] was taken to the operative suite where the above anesthesia was applied.  The patient was positioned supine and prepped and draped in normal sterile fashion.  An appropriate time out was performed.  After the administration of vancomycin and kefzol pre-op antibiotic the leg  was elevated and exsanguinated and a tourniquet inflated.  A standard longitudinal incision was made on the anterior knee.  Dissection was carried down to the extensor mechanism.  All appropriate anti-infective measures were used including the pre-operative antibiotic, betadine impregnated drape, and closed hooded exhaust systems for each member of the surgical team.  A medial parapatellar incision was made in the extensor mechanism and the knee cap flipped and the knee flexed.  Some residual meniscal tissues were removed along with any remaining ACL/PCL tissue.  A guide was placed on the tibia and a flat cut was made on it's superior surface.  An intramedullary guide was placed in the femur and was utilized to make anterior and posterior cuts creating an appropriate flexion gap.  A second intramedullary guide was placed in the femur to make a distal cut properly balancing the knee with an extension gap equal to the flexion gap.  The three bones sized to the above mentioned sizes and the appropriate guides were placed and utilized.  A trial reduction was done and the knee easily came to full extension and the patella tracked well on flexion.  The trial components were removed and all bones were cleaned with pulsatile lavage and then dried thoroughly.  Cement was mixed and was pressurized onto the bones followed by placement of the aforementioned components.  Excess cement was trimmed and pressure was held on the components until the cement had hardened.  The tourniquet was deflated and a small amount of bleeding was controlled with cautery and pressure.  The knee was irrigated thoroughly.  The extensor mechanism was re-approximated with #1 ethibond in interrupted fashion.  The  knee was flexed and the repair was solid.  The subcutaneous tissues were re-approximated with #0 and #2-0 vicryl and the skin closed with a subcuticular stitch and steristrips.  A sterile dressing was applied.  Intraoperative fluids, EBL, and  tourniquet time can be obtained from anesthesia records. ? ?DISPOSITION:  The patient was taken to recovery room in stable condition and scheduled to potentially go home same day depending on ability to walk and tolerate liquids. ? ?Hessie Dibble ?04/27/2021, 11:35 AM  ?

## 2021-04-27 NOTE — Anesthesia Procedure Notes (Signed)
Procedure Name: [place] ?Date/Time: 04/27/2021 10:05 AM ?Performed by: Claudia Desanctis, CRNA ?Pre-anesthesia Checklist: Patient identified, Emergency Drugs available, Suction available and Patient being monitored ?Patient Re-evaluated:Patient Re-evaluated prior to induction ?Oxygen Delivery Method: Simple face mask ? ? ? ? ?

## 2021-04-27 NOTE — Transfer of Care (Signed)
Immediate Anesthesia Transfer of Care Note ? ?Patient: Craig Bauer ? ?Procedure(s) Performed: LEFT TOTAL KNEE ARTHROPLASTY (Left: Knee) ? ?Patient Location: PACU ? ?Anesthesia Type:Spinal ? ?Level of Consciousness: awake, alert , oriented and patient cooperative ? ?Airway & Oxygen Therapy: Patient Spontanous Breathing and Patient connected to face mask ? ?Post-op Assessment: Report given to RN and Post -op Vital signs reviewed and stable ? ?Post vital signs: Reviewed and stable ? ?Last Vitals:  ?Vitals Value Taken Time  ?BP 110/72 04/27/21 1203  ?Temp    ?Pulse 67 04/27/21 1207  ?Resp 18 04/27/21 1207  ?SpO2 95 % 04/27/21 1207  ?Vitals shown include unvalidated device data. ? ?Last Pain:  ?Vitals:  ? 04/27/21 0844  ?TempSrc:   ?PainSc: 6   ?   ? ?  ? ?Complications: No notable events documented. ?

## 2021-04-27 NOTE — Evaluation (Signed)
Physical Therapy Evaluation ?Patient Details ?Name: Craig Bauer ?MRN: [id number] ?DOB: 12/26/1950 ?Today's Date: 04/27/2021 ? ?History of Present Illness ? Patient is 71 y.o. male s/p L- TKA on 04/26/21 with PMH significant for OA, DM, bil TSA, R-TKA  09/29/20, COPD, HTN,  ?Clinical Impression ? Craig Bauer is a 71 y.o. male POD 0 s/p L-TKA. Patient reports independence with mobility at baseline. Patient is now limited by functional impairments (see PT problem list below) and requires min guard for transfers and gait with RW. Patient was able to ambulate 60 feet with RW and min guard assist and cues for safe walker management. Patient educated on safe sequencing for stair mobility and verbalized safe guarding position for people assisting with mobility. Patient instructed in exercises to facilitate ROM and circulation. Patient will benefit from continued skilled PT interventions to address impairments and progress towards PLOF. Patient has met mobility goals at adequate level for discharge home; will continue to follow if pt continues acute stay to progress towards Mod I goals.   ?   ? ?Recommendations for follow up therapy are one component of a multi-disciplinary discharge planning process, led by the attending physician.  Recommendations may be updated based on patient status, additional functional criteria and insurance authorization. ? ?Follow Up Recommendations Follow physician's recommendations for discharge plan and follow up therapies ? ?  ?Assistance Recommended at Discharge Intermittent Supervision/Assistance  ?Patient can return home with the following ? A little help with walking and/or transfers;A little help with bathing/dressing/bathroom;Assistance with cooking/housework;Assist for transportation;Help with stairs or ramp for entrance ? ?  ?Equipment Recommendations None recommended by PT (pt has recommended DME)  ?Recommendations for Other Services ?    ?  ?Functional Status Assessment Patient has had  a recent decline in their functional status and demonstrates the ability to make significant improvements in function in a reasonable and predictable amount of time.  ? ?  ?Precautions / Restrictions Restrictions ?Weight Bearing Restrictions: No  ? ?  ? ?Mobility ? Bed Mobility ?Overal bed mobility: Needs Assistance ?Bed Mobility: Supine to Sit ?  ?  ?Supine to sit: Supervision ?  ?  ?General bed mobility comments: Pt supervision, no physical assist or VCs required. ?  ? ?Transfers ?Overall transfer level: Needs assistance ?Equipment used: Rolling walker (2 wheels) ?Transfers: Sit to/from Stand ?Sit to Stand: Min guard ?  ?  ?  ?  ?  ?General transfer comment: Pt min guard for safety only, no physical assist required. Upon standing pt demonstrated moderate anterior lean that was corrected upon cuing. ?  ? ?Ambulation/Gait ?Ambulation/Gait assistance: Min guard ?Gait Distance (Feet): 60 Feet ?Assistive device: Rolling walker (2 wheels) ?Gait Pattern/deviations: Step-to pattern, Step-through pattern ?Gait velocity: decreased ?  ?  ?General Gait Details: Pt ambulated with RW and min guard assist 62f, no physical assist required. VCs for proximity to device, decreasing step length, and decreasing speed. Demonstrated decreased stance time on L, but otherwise gait WFL. No overt LOB noted. ? ?Stairs ?Stairs: Yes ?Stairs assistance: Min guard ?Stair Management: Two rails, Step to pattern, Forwards ?Number of Stairs: 2 ?General stair comments: Pt completed stair training with min guard assist and VCs for sequencing of LEs. Pt slightly impulsive and attemped step-through pattern but was corrected to step-to pattern and demonstrated increased steadiness and safety. No overt LOB noted. ? ?Wheelchair Mobility ?  ? ?Modified Rankin (Stroke Patients Only) ?  ? ?  ? ?Balance Overall balance assessment: Needs assistance ?Sitting-balance support: Feet supported, No upper  extremity supported ?Sitting balance-Leahy Scale: Fair ?  ?   ?Standing balance support: Reliant on assistive device for balance, During functional activity, Bilateral upper extremity supported ?Standing balance-Leahy Scale: Poor ?Standing balance comment: Upon initial standing, demonstrated anterior lean that was corrected with cuing. ?  ?  ?  ?  ?  ?  ?  ?  ?  ?  ?  ?   ? ? ? ?Pertinent Vitals/Pain Pain Assessment ?Pain Assessment: No/denies pain  ? ? ?Home Living Family/patient expects to be discharged to:: Private residence ?Living Arrangements: Spouse/significant other;Children ?Available Help at Discharge: Family ?Type of Home: Mobile home ?Home Access: Stairs to enter ?Entrance Stairs-Rails: Right;Left;Can reach both ?Entrance Stairs-Number of Steps: 4 ?  ?Home Layout: One level ?Home Equipment: Cane - single point;Rollator (4 wheels);Rolling Walker (2 wheels) ?   ?  ?Prior Function Prior Level of Function : Independent/Modified Independent ?  ?  ?  ?  ?  ?  ?Mobility Comments: ind ?ADLs Comments: ind ?  ? ? ?Hand Dominance  ? Dominant Hand: Right ? ?  ?Extremity/Trunk Assessment  ? Upper Extremity Assessment ?Upper Extremity Assessment: Overall WFL for tasks assessed ?  ? ?Lower Extremity Assessment ?Lower Extremity Assessment: RLE deficits/detail;LLE deficits/detail ?RLE Deficits / Details: MMT ank pf/df 5/5 ?RLE Sensation: WNL ?LLE Deficits / Details: MMT ank pf/df 5/5, no extensor lag noted ?LLE Sensation: WNL ?  ? ?Cervical / Trunk Assessment ?Cervical / Trunk Assessment: Normal  ?Communication  ? Communication: No difficulties  ?Cognition Arousal/Alertness: Awake/alert ?Behavior During Therapy: [facility] for tasks assessed/performed ?Overall Cognitive Status: Within Functional Limits for tasks assessed ?  ?  ?  ?  ?  ?  ?  ?  ?  ?  ?  ?  ?  ?  ?  ?  ?  ?  ?  ? ?  ?General Comments   ? ?  ?Exercises Total Joint Exercises ?Ankle Circles/Pumps: AROM, 20 reps, Both ?Quad Sets: AROM, 10 reps, Left ?Short Arc Quad: AROM, Left, 10 reps ?Heel Slides: AROM, Left, 10 reps ?Hip  ABduction/ADduction: AROM, Left, 10 reps ?Straight Leg Raises: AROM, Left, 10 reps  ? ?Assessment/Plan  ?  ?PT Assessment Patient needs continued PT services  ?PT Problem List Decreased strength;Decreased range of motion;Decreased activity tolerance;Decreased balance;Decreased mobility;Decreased coordination;Decreased knowledge of use of DME;Decreased safety awareness;Pain ? ?   ?  ?PT Treatment Interventions DME instruction;Gait training;Stair training;Functional mobility training;Therapeutic activities;Therapeutic exercise;Balance training;Neuromuscular re-education;Patient/family education   ? ?PT Goals (Current goals can be found in the Care Plan section)  ?Acute Rehab PT Goals ?Patient Stated Goal: To walk normally ?PT Goal Formulation: With patient ?Time For Goal Achievement: 05/04/21 ?Potential to Achieve Goals: Good ? ?  ?Frequency 7X/week ?  ? ? ?Co-evaluation   ?  ?  ?  ?  ? ? ?  ?AM-PAC PT "6 Clicks" Mobility  ?Outcome Measure Help needed turning from your back to your side while in a flat bed without using bedrails?: None ?Help needed moving from lying on your back to sitting on the side of a flat bed without using bedrails?: A Little ?Help needed moving to and from a bed to a chair (including a wheelchair)?: A Little ?Help needed standing up from a chair using your arms (e.g., wheelchair or bedside chair)?: A Little ?Help needed to walk in hospital room?: A Little ?Help needed climbing 3-5 steps with a railing? : A Little ?6 Click Score: 19 ? ?  ?End of Session Equipment  Utilized During Treatment: Gait belt ?Activity Tolerance: Patient tolerated treatment well ?Patient left: in chair;with call bell/phone within reach;with nursing/sitter in room ?Nurse Communication: Mobility status ?PT Visit Diagnosis: Difficulty in walking, not elsewhere classified (R26.2) ?  ? ?Time: 8338-2505 ?PT Time Calculation (min) (ACUTE ONLY): 30 min ? ? ?Charges:   PT Evaluation ?$PT Eval Low Complexity: 1 Low ?PT  Treatments ?$Gait Training: 8-22 mins ?  ?   ? ? ?Coolidge Breeze, PT, DPT ?[facility] ?Office: [phone] ?Pager: [phone] ? ?Coolidge Breeze ?04/27/2021, 2:56 PM ?

## 2021-04-27 NOTE — Anesthesia Postprocedure Evaluation (Signed)
Anesthesia Post Note ? ?Patient: Craig Bauer ? ?Procedure(s) Performed: LEFT TOTAL KNEE ARTHROPLASTY (Left: Knee) ? ?  ? ?Patient location during evaluation: PACU ?Anesthesia Type: MAC and Spinal ?Level of consciousness: awake and alert ?Pain management: pain level controlled ?Vital Signs Assessment: post-procedure vital signs reviewed and stable ?Respiratory status: spontaneous breathing and respiratory function stable ?Cardiovascular status: blood pressure returned to baseline and stable ?Postop Assessment: spinal receding ?Anesthetic complications: no ? ? ?No notable events documented. ? ?Last Vitals:  ?Vitals:  ? 04/27/21 1400 04/27/21 1500  ?BP: (!) 129/59 120/66  ?Pulse: (!) 50 62  ?Resp: 16   ?Temp:  36.6 ?C  ?SpO2: 96% 98%  ?  ?Last Pain:  ?Vitals:  ? 04/27/21 1500  ?TempSrc:   ?PainSc: 0-No pain  ? ? ?  ?  ?  ?  ?  ?  ? ?[name] DANIEL ? ? ? ? ?

## 2021-04-27 NOTE — Interval H&P Note (Signed)
History and Physical Interval Note: ? ?04/27/2021 ?9:02 AM ? ?Craig Bauer  has presented today for surgery, with the diagnosis of LEFT KNEE DEGENERATIVE JOINT DISEASE.  The various methods of treatment have been discussed with the patient and family. After consideration of risks, benefits and other options for treatment, the patient has consented to  Procedure(s): ?LEFT TOTAL KNEE ARTHROPLASTY (Left) as a surgical intervention.  The patient's history has been reviewed, patient examined, no change in status, stable for surgery.  I have reviewed the patient's chart and labs.  Questions were answered to the patient's satisfaction.   ? ? ?[name] ? ? ?

## 2021-04-27 NOTE — Anesthesia Procedure Notes (Signed)
Spinal ? ?Patient location during procedure: OR ?Start time: 04/27/2021 9:55 AM ?End time: 04/27/2021 10:00 AM ?Reason for block: surgical anesthesia ?Staffing ?Performed: resident/CRNA  ?Resident/CRNA: Vanessa Wolf Trap, CRNA ?Preanesthetic Checklist ?Completed: patient identified, IV checked, site marked, risks and benefits discussed, surgical consent, monitors and equipment checked, pre-op evaluation and timeout performed ?Spinal Block ?Patient position: sitting ?Prep: DuraPrep ?Patient monitoring: heart rate, cardiac monitor, continuous pulse ox and blood pressure ?Approach: midline ?Location: L3-4 ?Injection technique: single-shot ?Needle ?Needle type: Pencan  ?Needle gauge: 24 G ?Needle length: 10 cm ?Needle insertion depth: 8 cm ?Assessment ?Sensory level: T4 ?Events: CSF return ? ? ? ?

## 2021-04-27 NOTE — Anesthesia Procedure Notes (Signed)
Anesthesia Regional Block: Adductor canal block  ? ?Pre-Anesthetic Checklist: , timeout performed,  Correct Patient, Correct Site, Correct Laterality,  Correct Procedure, Correct Position, site marked,  Risks and benefits discussed,  Surgical consent,  Pre-op evaluation,  At surgeon's request and post-op pain management ? ?Laterality: Left ? ?Prep: chloraprep     ?  ?Needles:  ?Injection technique: Single-shot ? ?Needle Type: Stimulator Needle - 80   ? ? ?Needle Length: 10cm  ?Needle Gauge: 21  ? ? ? ?Additional Needles: ? ? ?Narrative:  ?Start time: 04/27/2021 8:55 AM ?End time: 04/27/2021 9:03 AM ?Injection made incrementally with aspirations every 5 mL. ? ?Performed by: Personally  ?Anesthesiologist: Heather Roberts, MD ? ? ? ? ?

## 2021-04-28 ENCOUNTER — Encounter (HOSPITAL_COMMUNITY): Payer: Self-pay | Admitting: Orthopaedic Surgery

## 2021-05-01 ENCOUNTER — Emergency Department (HOSPITAL_COMMUNITY): Payer: Medicare HMO

## 2021-05-01 ENCOUNTER — Inpatient Hospital Stay (HOSPITAL_COMMUNITY)
Admission: EM | Admit: 2021-05-01 | Discharge: 2021-05-03 | DRG: 871 | Disposition: A | Discharge status: Home Health | Payer: Medicare HMO | Attending: Family Medicine | Admitting: Family Medicine

## 2021-05-01 ENCOUNTER — Encounter (HOSPITAL_COMMUNITY): Payer: Self-pay

## 2021-05-01 DIAGNOSIS — A419 Sepsis, unspecified organism: Principal | ICD-10-CM | POA: Insufficient documentation

## 2021-05-01 DIAGNOSIS — Z7901 Long term (current) use of anticoagulants: Secondary | ICD-10-CM

## 2021-05-01 DIAGNOSIS — Z91040 Latex allergy status: Secondary | ICD-10-CM | POA: Diagnosis not present

## 2021-05-01 DIAGNOSIS — Z7984 Long term (current) use of oral hypoglycemic drugs: Secondary | ICD-10-CM | POA: Diagnosis not present

## 2021-05-01 DIAGNOSIS — A4189 Other specified sepsis: Secondary | ICD-10-CM | POA: Diagnosis not present

## 2021-05-01 DIAGNOSIS — R6520 Severe sepsis without septic shock: Secondary | ICD-10-CM | POA: Diagnosis not present

## 2021-05-01 DIAGNOSIS — Z880 Allergy status to penicillin: Secondary | ICD-10-CM

## 2021-05-01 DIAGNOSIS — Z91041 Radiographic dye allergy status: Secondary | ICD-10-CM | POA: Diagnosis not present

## 2021-05-01 DIAGNOSIS — I10 Essential (primary) hypertension: Secondary | ICD-10-CM | POA: Diagnosis present

## 2021-05-01 DIAGNOSIS — Z96611 Presence of right artificial shoulder joint: Secondary | ICD-10-CM | POA: Diagnosis present

## 2021-05-01 DIAGNOSIS — Z96612 Presence of left artificial shoulder joint: Secondary | ICD-10-CM | POA: Diagnosis present

## 2021-05-01 DIAGNOSIS — Z87891 Personal history of nicotine dependence: Secondary | ICD-10-CM

## 2021-05-01 DIAGNOSIS — L03115 Cellulitis of right lower limb: Secondary | ICD-10-CM | POA: Diagnosis present

## 2021-05-01 DIAGNOSIS — R600 Localized edema: Secondary | ICD-10-CM | POA: Diagnosis present

## 2021-05-01 DIAGNOSIS — E8770 Fluid overload, unspecified: Secondary | ICD-10-CM | POA: Diagnosis present

## 2021-05-01 DIAGNOSIS — R739 Hyperglycemia, unspecified: Principal | ICD-10-CM

## 2021-05-01 DIAGNOSIS — M1990 Unspecified osteoarthritis, unspecified site: Secondary | ICD-10-CM | POA: Diagnosis present

## 2021-05-01 DIAGNOSIS — R9389 Abnormal findings on diagnostic imaging of other specified body structures: Secondary | ICD-10-CM

## 2021-05-01 DIAGNOSIS — M25469 Effusion, unspecified knee: Secondary | ICD-10-CM | POA: Diagnosis not present

## 2021-05-01 DIAGNOSIS — G9341 Metabolic encephalopathy: Secondary | ICD-10-CM

## 2021-05-01 DIAGNOSIS — L03116 Cellulitis of left lower limb: Secondary | ICD-10-CM | POA: Diagnosis present

## 2021-05-01 DIAGNOSIS — G4730 Sleep apnea, unspecified: Secondary | ICD-10-CM | POA: Diagnosis present

## 2021-05-01 DIAGNOSIS — L03119 Cellulitis of unspecified part of limb: Secondary | ICD-10-CM | POA: Diagnosis present

## 2021-05-01 DIAGNOSIS — Z881 Allergy status to other antibiotic agents status: Secondary | ICD-10-CM | POA: Diagnosis not present

## 2021-05-01 DIAGNOSIS — Z96653 Presence of artificial knee joint, bilateral: Secondary | ICD-10-CM | POA: Diagnosis present

## 2021-05-01 DIAGNOSIS — G43909 Migraine, unspecified, not intractable, without status migrainosus: Secondary | ICD-10-CM | POA: Diagnosis present

## 2021-05-01 DIAGNOSIS — Z20822 Contact with and (suspected) exposure to covid-19: Secondary | ICD-10-CM | POA: Diagnosis present

## 2021-05-01 DIAGNOSIS — E1165 Type 2 diabetes mellitus with hyperglycemia: Secondary | ICD-10-CM | POA: Diagnosis present

## 2021-05-01 DIAGNOSIS — Z794 Long term (current) use of insulin: Secondary | ICD-10-CM | POA: Diagnosis not present

## 2021-05-01 DIAGNOSIS — J449 Chronic obstructive pulmonary disease, unspecified: Secondary | ICD-10-CM | POA: Diagnosis present

## 2021-05-01 DIAGNOSIS — D72829 Elevated white blood cell count, unspecified: Secondary | ICD-10-CM | POA: Diagnosis present

## 2021-05-01 DIAGNOSIS — E871 Hypo-osmolality and hyponatremia: Secondary | ICD-10-CM | POA: Diagnosis present

## 2021-05-01 DIAGNOSIS — M25562 Pain in left knee: Secondary | ICD-10-CM | POA: Diagnosis not present

## 2021-05-01 DIAGNOSIS — R29898 Other symptoms and signs involving the musculoskeletal system: Secondary | ICD-10-CM | POA: Diagnosis not present

## 2021-05-01 DIAGNOSIS — Z79891 Long term (current) use of opiate analgesic: Secondary | ICD-10-CM

## 2021-05-01 DIAGNOSIS — G459 Transient cerebral ischemic attack, unspecified: Secondary | ICD-10-CM

## 2021-05-01 LAB — CBC
HCT: 43.3 % (ref 39.0–52.0)
Hemoglobin: 14.3 g/dL (ref 13.0–17.0)
MCH: 29.5 pg (ref 26.0–34.0)
MCHC: 33 g/dL (ref 30.0–36.0)
MCV: 89.5 fL (ref 80.0–100.0)
Platelets: 288 10*3/uL (ref 150–400)
RBC: 4.84 MIL/uL (ref 4.22–5.81)
RDW: 14.2 % (ref 11.5–15.5)
WBC: 11.8 10*3/uL — ABNORMAL HIGH (ref 4.0–10.5)
nRBC: 0 % (ref 0.0–0.2)

## 2021-05-01 LAB — COMPREHENSIVE METABOLIC PANEL
ALT: 11 U/L (ref 0–44)
AST: 12 U/L — ABNORMAL LOW (ref 15–41)
Albumin: 3.2 g/dL — ABNORMAL LOW (ref 3.5–5.0)
Alkaline Phosphatase: 47 U/L (ref 38–126)
Anion gap: 8 (ref 5–15)
BUN: 17 mg/dL (ref 8–23)
CO2: 24 mmol/L (ref 22–32)
Calcium: 8.7 mg/dL — ABNORMAL LOW (ref 8.9–10.3)
Chloride: 96 mmol/L — ABNORMAL LOW (ref 98–111)
Creatinine, Ser: 0.94 mg/dL (ref 0.61–1.24)
GFR, Estimated: >60 mL/min (ref >60)
Glucose, Bld: 400 mg/dL — ABNORMAL HIGH (ref 70–99)
Potassium: 4.7 mmol/L (ref 3.5–5.1)
Sodium: 128 mmol/L — ABNORMAL LOW (ref 135–145)
Total Bilirubin: 0.7 mg/dL (ref 0.3–1.2)
Total Protein: 6.8 g/dL (ref 6.5–8.1)

## 2021-05-01 LAB — RESP PANEL BY RT-PCR (FLU A&B, COVID) ARPGX2
Influenza A by PCR: NEGATIVE
Influenza B by PCR: NEGATIVE
SARS Coronavirus 2 by RT PCR: NEGATIVE

## 2021-05-01 LAB — I-STAT CHEM 8, ED
BUN: 18 mg/dL (ref 8–23)
Calcium, Ion: 1.14 mmol/L — ABNORMAL LOW (ref 1.15–1.40)
Chloride: 94 mmol/L — ABNORMAL LOW (ref 98–111)
Creatinine, Ser: 0.8 mg/dL (ref 0.61–1.24)
Glucose, Bld: 418 mg/dL — ABNORMAL HIGH (ref 70–99)
HCT: 46 % (ref 39.0–52.0)
Hemoglobin: 15.6 g/dL (ref 13.0–17.0)
Potassium: 5 mmol/L (ref 3.5–5.1)
Sodium: 129 mmol/L — ABNORMAL LOW (ref 135–145)
TCO2: 27 mmol/L (ref 22–32)

## 2021-05-01 LAB — DIFFERENTIAL
Abs Immature Granulocytes: 0.05 10*3/uL (ref 0.00–0.07)
Basophils Absolute: 0 10*3/uL (ref 0.0–0.1)
Basophils Relative: 0 %
Eosinophils Absolute: 0.1 10*3/uL (ref 0.0–0.5)
Eosinophils Relative: 1 %
Immature Granulocytes: 0 %
Lymphocytes Relative: 16 %
Lymphs Abs: 1.9 10*3/uL (ref 0.7–4.0)
Monocytes Absolute: 1 10*3/uL (ref 0.1–1.0)
Monocytes Relative: 8 %
Neutro Abs: 8.7 10*3/uL — ABNORMAL HIGH (ref 1.7–7.7)
Neutrophils Relative %: 75 %

## 2021-05-01 LAB — PROTIME-INR
INR: 1 (ref 0.8–1.2)
Prothrombin Time: 13.4 seconds (ref 11.4–15.2)

## 2021-05-01 LAB — APTT: aPTT: 27 seconds (ref 24–36)

## 2021-05-01 LAB — CBG MONITORING, ED: Glucose-Capillary: 332 mg/dL — ABNORMAL HIGH (ref 70–99)

## 2021-05-01 LAB — LACTIC ACID, PLASMA: Lactic Acid, Venous: 1.7 mmol/L (ref 0.5–1.9)

## 2021-05-01 LAB — ETHANOL: Alcohol, Ethyl (B): <10 mg/dL (ref <10)

## 2021-05-01 LAB — GLUCOSE, CAPILLARY
Glucose-Capillary: 170 mg/dL — ABNORMAL HIGH (ref 70–99)
Glucose-Capillary: 208 mg/dL — ABNORMAL HIGH (ref 70–99)

## 2021-05-01 MED ORDER — VANCOMYCIN HCL 2000 MG/400ML IV SOLN
2000.0000 mg | Freq: Once | INTRAVENOUS | Status: DC
  Filled 2021-05-01: qty 400

## 2021-05-01 MED ORDER — SODIUM CHLORIDE 0.9 % IV SOLN
2.0000 g | Freq: Once | INTRAVENOUS | Status: DC
Start: 2021-05-01 — End: 2021-05-01

## 2021-05-01 MED ORDER — SODIUM CHLORIDE 0.9 % IV SOLN: 2.0000 g | Freq: Every day | INTRAVENOUS | Status: DC

## 2021-05-01 MED ORDER — ACETAMINOPHEN 325 MG PO TABS
650.0000 mg | ORAL_TABLET | Freq: Four times a day (QID) | ORAL | Status: DC | PRN
Start: 2021-05-01 — End: 2021-05-03
  Administered 2021-05-02: 650 mg via ORAL
  Filled 2021-05-01: qty 2

## 2021-05-01 MED ORDER — POLYETHYLENE GLYCOL 3350 17 G PO PACK: 17.0000 g | PACK | Freq: Every day | ORAL | Status: DC | PRN

## 2021-05-01 MED ORDER — SODIUM CHLORIDE 0.9 % IV SOLN: 250.0000 mL | INTRAVENOUS | Status: DC | PRN

## 2021-05-01 MED ORDER — RIVAROXABAN 10 MG PO TABS
20.0000 mg | ORAL_TABLET | Freq: Every day | ORAL | Status: DC
  Administered 2021-05-02 – 2021-05-03 (×2): 20 mg via ORAL
  Filled 2021-05-01 (×2): qty 2

## 2021-05-01 MED ORDER — INSULIN GLARGINE-YFGN 100 UNIT/ML ~~LOC~~ SOLN
10.0000 [IU] | Freq: Two times a day (BID) | SUBCUTANEOUS | Status: DC
  Administered 2021-05-01 – 2021-05-03 (×4): 10 [IU] via SUBCUTANEOUS
  Filled 2021-05-01 (×5): qty 0.1

## 2021-05-01 MED ORDER — VANCOMYCIN HCL IN DEXTROSE 1-5 GM/200ML-% IV SOLN
1000.0000 mg | Freq: Two times a day (BID) | INTRAVENOUS | Status: DC
  Administered 2021-05-02 – 2021-05-03 (×3): 1000 mg via INTRAVENOUS
  Filled 2021-05-01 (×4): qty 200

## 2021-05-01 MED ORDER — DULOXETINE HCL 60 MG PO CPEP
60.0000 mg | ORAL_CAPSULE | Freq: Every day | ORAL | Status: DC
  Administered 2021-05-02 – 2021-05-03 (×2): 60 mg via ORAL
  Filled 2021-05-01 (×2): qty 1

## 2021-05-01 MED ORDER — SODIUM CHLORIDE 0.9 % IV SOLN
2.0000 g | Freq: Every day | INTRAVENOUS | Status: DC
  Administered 2021-05-01 – 2021-05-03 (×3): 2 g via INTRAVENOUS
  Filled 2021-05-01 (×3): qty 20

## 2021-05-01 MED ORDER — VANCOMYCIN HCL 2000 MG/400ML IV SOLN
2000.0000 mg | Freq: Once | INTRAVENOUS | Status: AC
  Administered 2021-05-01: 2000 mg via INTRAVENOUS
  Filled 2021-05-01: qty 400

## 2021-05-01 MED ORDER — DAPAGLIFLOZIN PROPANEDIOL 5 MG PO TABS
5.0000 mg | ORAL_TABLET | Freq: Every day | ORAL | Status: DC
  Administered 2021-05-02 – 2021-05-03 (×2): 5 mg via ORAL
  Filled 2021-05-01 (×2): qty 1

## 2021-05-01 MED ORDER — INSULIN ASPART 100 UNIT/ML IJ SOLN
0.0000 [IU] | INTRAMUSCULAR | Status: DC
  Administered 2021-05-02: 5 [IU] via SUBCUTANEOUS
  Administered 2021-05-02: 3 [IU] via SUBCUTANEOUS
  Filled 2021-05-01: qty 0.15

## 2021-05-01 MED ORDER — SODIUM CHLORIDE 0.9% FLUSH: 3.0000 mL | INTRAVENOUS | Status: DC | PRN

## 2021-05-01 MED ORDER — SODIUM CHLORIDE 0.9% FLUSH
3.0000 mL | Freq: Two times a day (BID) | INTRAVENOUS | Status: DC
  Administered 2021-05-01 – 2021-05-02 (×2): 3 mL via INTRAVENOUS

## 2021-05-01 MED ORDER — ACETAMINOPHEN 650 MG RE SUPP: 650.0000 mg | Freq: Four times a day (QID) | RECTAL | Status: DC | PRN

## 2021-05-01 MED ORDER — LORAZEPAM 2 MG/ML IJ SOLN
1.0000 mg | Freq: Once | INTRAMUSCULAR | Status: AC | PRN
  Administered 2021-05-01: 1 mg via INTRAVENOUS
  Filled 2021-05-01: qty 1

## 2021-05-01 MED ORDER — RIVAROXABAN 10 MG PO TABS: 10.0000 mg | ORAL_TABLET | Freq: Every day | ORAL | Status: DC

## 2021-05-01 MED ORDER — POLYETHYLENE GLYCOL 3350 17 G PO PACK
17.0000 g | PACK | Freq: Two times a day (BID) | ORAL | Status: AC
  Administered 2021-05-01 – 2021-05-03 (×4): 17 g via ORAL
  Filled 2021-05-01 (×4): qty 1

## 2021-05-01 MED ORDER — FUROSEMIDE 40 MG PO TABS
80.0000 mg | ORAL_TABLET | Freq: Two times a day (BID) | ORAL | Status: DC
  Administered 2021-05-01 – 2021-05-03 (×4): 80 mg via ORAL
  Filled 2021-05-01 (×4): qty 2

## 2021-05-01 MED ORDER — POTASSIUM CHLORIDE CRYS ER 20 MEQ PO TBCR
40.0000 meq | EXTENDED_RELEASE_TABLET | Freq: Three times a day (TID) | ORAL | Status: DC
  Administered 2021-05-01: 40 meq via ORAL
  Filled 2021-05-01: qty 2

## 2021-05-01 MED ORDER — CEFAZOLIN SODIUM-DEXTROSE 1-4 GM/50ML-% IV SOLN
1.0000 g | Freq: Three times a day (TID) | INTRAVENOUS | Status: DC
  Administered 2021-05-01: 1 g via INTRAVENOUS
  Filled 2021-05-01: qty 50

## 2021-05-01 MED ORDER — SUMATRIPTAN SUCCINATE 50 MG PO TABS
100.0000 mg | ORAL_TABLET | ORAL | Status: DC | PRN
  Filled 2021-05-01: qty 2

## 2021-05-01 MED ORDER — SODIUM CHLORIDE 0.9 % IV SOLN: 1.0000 g | INTRAVENOUS | Status: DC

## 2021-05-01 MED ORDER — INSULIN ASPART 100 UNIT/ML IJ SOLN
10.0000 [IU] | Freq: Once | INTRAMUSCULAR | Status: AC
  Administered 2021-05-01: 10 [IU] via SUBCUTANEOUS
  Filled 2021-05-01: qty 0.1

## 2021-05-01 MED ORDER — PRAVASTATIN SODIUM 20 MG PO TABS: 40.0000 mg | ORAL_TABLET | Freq: Every day | ORAL | Status: DC

## 2021-05-01 MED ORDER — ALBUTEROL SULFATE (2.5 MG/3ML) 0.083% IN NEBU: 3.0000 mL | INHALATION_SOLUTION | Freq: Four times a day (QID) | RESPIRATORY_TRACT | Status: DC | PRN

## 2021-05-01 MED ORDER — PANTOPRAZOLE SODIUM 40 MG PO TBEC
40.0000 mg | DELAYED_RELEASE_TABLET | Freq: Every day | ORAL | Status: DC
  Administered 2021-05-02 – 2021-05-03 (×2): 40 mg via ORAL
  Filled 2021-05-01 (×2): qty 1

## 2021-05-01 NOTE — ED Notes (Signed)
ED at bedside.  

## 2021-05-01 NOTE — ED Notes (Signed)
Try to get urine from pt but he is sleeping deep.  ? ?

## 2021-05-01 NOTE — ED Provider Notes (Signed)
?[facility]-EMERGENCY DEPT ?Provider Note ? ? ?CSN: [id number] ?Arrival date & time: 05/01/21  1208 ? ?  ? ?History ? ?Chief Complaint  ?Patient presents with  ? Numbness  ? Knee Pain  ? ? ?Craig Bauer is a 71 y.o. male. ? ? ?Knee Pain ?Associated symptoms: no fever   ? ?Patient has history of diabetes, hypertension, COPD, sleep apnea who recently was in the hospital for knee surgery.  And had a total knee replacement for osteoarthritis of his left knee on April 18. ? ?Patient presents with 2 concerns.  Patient has noticed some increasing redness and swelling of his left leg.  He has not had any fevers but the physical therapist was concerned about the possibility of developing cellulitis as the redness seems to be increasing.  Patient does have history of having trouble with cellulitis in the past. ? ?Patient also developed difficulty with numbness and weakness in his right arm that started last evening.  Family noticed changes last night at about 9 PM.  Patient has had some trouble with decreased grip strength.  He feels somewhat numb in that arm.  He is not having any trouble with blurred vision at this time.  No complaints of speech difficulty ? ?Home Medications ?Prior to Admission medications   ?Medication Sig Start Date End Date Taking? Authorizing Provider  ?albuterol (VENTOLIN HFA) 108 (90 Base) MCG/ACT inhaler Inhale 2 puffs into the lungs every 6 (six) hours as needed for wheezing or shortness of breath.    [provider]  ?cyclobenzaprine (FLEXERIL) 10 MG tablet Take 10 mg by mouth daily as needed for muscle spasms.    [provider]  ?dapagliflozin propanediol (FARXIGA) 5 MG TABS tablet Take 5 mg by mouth daily.    [provider]  ?diclofenac Sodium (VOLTAREN) 1 % GEL Apply 1 application topically 4 (four) times daily as needed (pain).    [provider]  ?DULoxetine (CYMBALTA) 60 MG capsule Take 60 mg by mouth daily.    [provider]   ?furosemide (LASIX) 40 MG tablet Take 80 mg by mouth 2 (two) times daily. 05/13/20   [provider]  ?gabapentin (NEURONTIN) 800 MG tablet Take 800 mg by mouth 3 (three) times daily. 05/11/20   [provider]  ?insulin glargine (LANTUS SOLOSTAR) 100 UNIT/ML Solostar Pen Inject 26 Units into the skin at bedtime.    [provider]  ?Lidocaine 2 % GEL Apply 1 application topically daily as needed (pain).    [provider]  ?metFORMIN (GLUCOPHAGE) 500 MG tablet Take 500 mg by mouth 2 (two) times daily. 05/13/20   [provider]  ?mirtazapine (REMERON) 30 MG tablet Take 45 mg by mouth at bedtime. 05/11/20   [provider]  ?mupirocin ointment (BACTROBAN) 2 % Apply 1 application. topically 3 (three) times daily. 04/07/21   [provider]  ?omeprazole (PRILOSEC) 40 MG capsule Take 40 mg by mouth daily. 05/13/20   [provider]  ?oxyCODONE 10 MG TABS Take 0.5-1 tablets (5-10 mg total) by mouth every 8 (eight) hours as needed for breakthrough pain (in addition to baseline meds for post op pain). 04/27/21 04/27/22  Craig Florence, PA-C  ?Oxycodone HCl 20 MG TABS Take 20 mg by mouth 4 (four) times daily as needed (pain).    [provider]  ?potassium chloride SA (KLOR-CON) 20 MEQ tablet Take 40 mEq by mouth 3 (three) times daily. 03/23/20   [provider]  ?pravastatin (PRAVACHOL)  40 MG tablet Take 40 mg by mouth at bedtime.    [provider]  ?rivaroxaban (XARELTO) 20 MG TABS tablet Take 20 mg by mouth daily.    [provider]  ?rOPINIRole (REQUIP) 2 MG tablet Take 2 mg by mouth at bedtime.    [provider]  ?sitaGLIPtin (JANUVIA) 100 MG tablet Take 100 mg by mouth daily.    [provider]  ?SUMAtriptan (IMITREX) 100 MG tablet Take 100 mg by mouth every 2 (two) hours as needed for migraine. May repeat in 2 hours if headache persists or recurs.    [provider]  ?tirzepatide Greggory Keen) 5  MG/0.5ML Pen Inject 5 mg into the skin every Friday.    [provider]  ?topiramate (TOPAMAX) 200 MG tablet Take 200-400 mg by mouth See admin instructions. Take 200 mg in the morning and 400 mg at bedtime    [provider]  ?XTAMPZA ER 36 MG C12A Take 36 mg by mouth every 8 (eight) hours. 05/18/20   [provider]  ?   ? ?Allergies    ?Contrast media [iodinated contrast media], Ciprofloxacin, Latex, and Penicillins   ? ?Review of Systems   ?Review of Systems  ?Constitutional:  Negative for fever.  ? ?Physical Exam ?Updated Vital Signs ?BP 123/64   Pulse 87   Temp 99 ?F (37.2 ?C) (Oral)   Resp 18   SpO2 96%  ?Physical Exam ?Vitals and nursing note reviewed.  ?Constitutional:   ?   General: He is not in acute distress. ?   Appearance: He is well-developed.  ?HENT:  ?   Head: Normocephalic and atraumatic.  ?   Right Ear: External ear normal.  ?   Left Ear: External ear normal.  ?Eyes:  ?   General: No scleral icterus.    ?   Right eye: No discharge.     ?   Left eye: No discharge.  ?   Conjunctiva/sclera: Conjunctivae normal.  ?Neck:  ?   Trachea: No tracheal deviation.  ?Cardiovascular:  ?   Rate and Rhythm: Normal rate and regular rhythm.  ?Pulmonary:  ?   Effort: Pulmonary effort is normal. No respiratory distress.  ?   Breath sounds: Normal breath sounds. No stridor. No wheezing or rales.  ?Abdominal:  ?   General: Bowel sounds are normal. There is no distension.  ?   Palpations: Abdomen is soft.  ?   Tenderness: There is no abdominal tenderness. There is no guarding or rebound.  ?Musculoskeletal:     ?   General: No deformity.  ?   Cervical back: Neck supple.  ?   Left knee: Tenderness present.  ?   Comments: Erythema around left knee, ecchymoses edema noted left lower extremity  ?Skin: ?   General: Skin is warm and dry.  ?   Findings: No rash.  ?Neurological:  ?   General: No focal deficit present.  ?   Mental Status: He is alert and oriented to person, place, and time.  ?    Cranial Nerves: No cranial nerve deficit (no facial droop, extraocular movements intact, no slurred speech).  ?   Sensory: No sensory deficit.  ?   Motor: No abnormal muscle tone or seizure activity.  ?   Coordination: Coordination normal.  ?   Comments: Right pronator drift upper extrem, weak grip strength right, able to hold right  off bed for 5 seconds, unable to lift left leg but that is the surgical  leg, sensation intact in all extremities, no visual field cuts, no left or right sided neglect, , no nystagmus noted ? ?No facial droop, extraocular movements intact, tongue midline  ?Psychiatric:     ?   Mood and Affect: Mood normal.  ? ? ?ED Results / Procedures / Treatments   ?Labs ?(all labs ordered are listed, but only abnormal results are displayed) ?Labs Reviewed  ?CBC - Abnormal; Notable for the following components:  ?    Result Value  ? WBC 11.8 (*)   ? All other components within normal limits  ?DIFFERENTIAL - Abnormal; Notable for the following components:  ? Neutro Abs 8.7 (*)   ? All other components within normal limits  ?COMPREHENSIVE METABOLIC PANEL - Abnormal; Notable for the following components:  ? Sodium 128 (*)   ? Chloride 96 (*)   ? Glucose, Bld 400 (*)   ? Calcium 8.7 (*)   ? Albumin 3.2 (*)   ? AST 12 (*)   ? All other components within normal limits  ?I-STAT CHEM 8, ED - Abnormal; Notable for the following components:  ? Sodium 129 (*)   ? Chloride 94 (*)   ? Glucose, Bld 418 (*)   ? Calcium, Ion 1.14 (*)   ? All other components within normal limits  ?CBG MONITORING, ED - Abnormal; Notable for the following components:  ? Glucose-Capillary 332 (*)   ? All other components within normal limits  ?RESP PANEL BY RT-PCR (FLU A&B, COVID) [id number]  ?CULTURE, BLOOD (ROUTINE X 2)  ?CULTURE, BLOOD (ROUTINE X 2)  ?ETHANOL  ?PROTIME-INR  ?APTT  ?LACTIC ACID, PLASMA  ?RAPID URINE DRUG SCREEN, HOSP PERFORMED  ?LACTIC ACID, PLASMA  ? ? ?EKG ?EKG Interpretation ? ?Date/Time:  Saturday May 01 2021  12:53:22 EDT ?Ventricular Rate:  101 ?PR Interval:  197 ?QRS Duration: 93 ?QT Interval:  325 ?QTC Calculation: 422 ?R Axis:   -57 ?Text Interpretation: Sinus tachycardia Probable left atrial enlargement Left anterior f

## 2021-05-01 NOTE — Progress Notes (Signed)
Pharmacy Antibiotic Note ? ?Craig Bauer is a 71 y.o. male admitted on 05/01/2021 with cellulitis.  Pharmacy has been consulted for vancomycin dosing. ? ?Pt with TKA on 4/18 who returns to ED after increased redness and swelling of the right lower extremity.  Orthopedic evaluation states " The erythema around the knee itself alone appears to be normal post operative swelling and most likely subcutaneous hematoma. However the edema and erythema in the lower leg is more concerning. Likely cellulitic in nature and not related to an infected TKA."  ? ? ?Plan: ?Vancomycin 2000 mg IV x1 then 1000 mg IV q12h ( AUC 528, SCr 0.8 wt 1021. Kg )  ?Ceftiaxone 2 gr IV daily  ?Monitor clinical course, renal function, cultures as available ? ? ?  ? ?Temp (24hrs), Avg:98.6 ?F (37 ?C), Min:98.2 ?F (36.8 ?C), Max:99 ?F (37.2 ?C) ? ?Recent Labs  ?Lab 05/01/21 ?1253 05/01/21 ?1254 05/01/21 ?1302  ?WBC 11.8*  --   --   ?CREATININE 0.94  --  0.80  ?LATICACIDVEN  --  1.7  --   ?  ?Estimated Creatinine Clearance: 101.5 mL/min (by C-G formula based on SCr of 0.8 mg/dL).   ? ?Allergies  ?Allergen Reactions  ? Contrast Media [Iodinated Contrast Media] Anaphylaxis  ? Ciprofloxacin Hives  ? Latex Hives  ? Penicillins Hives  ? ? ?Antimicrobials this admission: ?4/22 cefazolin x 1  ?4/22 ceftriaxone  >>  ?4/22 vancomycin >>  ? ?Dose adjustments this admission: ?  ? ?Microbiology results: ?4/22 BCx:  ?  ? ?Thank you for allowing pharmacy to be a part of this patient?s care. ? ?Adalberto Cole, PharmD, BCPS ?05/01/2021 6:24 PM ? ? ?

## 2021-05-01 NOTE — Consult Note (Addendum)
Reason for Consult:left knee erythema 5 days s/p L TKA ?Referring Physician: ED ? ?Ardell IsaacsDavid Sopp is an 71 y.o. male.  ?HPI: Patient has history of diabetes, hypertension, COPD, sleep apnea who recently was in the hospital for left knee TKA on April 18 by Dr Jerl Santosalldorf ?  ?Patient presented to the ED with 2 concerns.  Patient has noticed some increasing redness and swelling of his left leg over the last couple days.  Physical therapist was concerned about the possibility of developing cellulitis as the redness seems to be increasing.  Patient does have history of having trouble with cellulitis in the past. Physical therapist reports that he had a temperature of 100-101 on Thursday evening but no fever since.  ?  ?Patient also developed difficulty with numbness and weakness in his right arm that started last evening.  Family noticed changes last night around 9 PM.  Patient has had some trouble with decreased grip strength.  He feels somewhat numb in that arm.  He is not having any trouble with blurred vision at this time.  No complaints of speech difficulty. Patient denies history of stroke.  ? ?Past Medical History:  ?Diagnosis Date  ? Arthritis   ? Blood clot in vein   ? COPD (chronic obstructive pulmonary disease) (HCC)   ? Diabetes mellitus without complication (HCC)   ? Headache   ? migraine  ? Hypertension   ? Sleep apnea   ? ? ?Past Surgical History:  ?Procedure Laterality Date  ? HAND SURGERY Left   ? HERNIA REPAIR    ? SHOULDER ARTHROSCOPY W/ ROTATOR CUFF REPAIR Bilateral   ? TOTAL KNEE ARTHROPLASTY Right 09/29/2020  ? Procedure: RIGHT TOTAL KNEE ARTHROPLASTY;  Surgeon: Marcene Corningalldorf, Peter, MD;  Location: [facility];  Service: Orthopedics;  Laterality: Right;  ? TOTAL KNEE ARTHROPLASTY Left 04/27/2021  ? Procedure: LEFT TOTAL KNEE ARTHROPLASTY;  Surgeon: Marcene Corningalldorf, Peter, MD;  Location: [facility];  Service: Orthopedics;  Laterality: Left;  ? TOTAL SHOULDER ARTHROPLASTY Bilateral   ? ? ?History reviewed. No pertinent family  history. ? ?Social History:  reports that he quit smoking about 7 months ago. His smoking use included cigarettes. He has never used smokeless tobacco. He reports that he does not currently use alcohol. He reports that he does not use drugs. ? ?Allergies:  ?Allergies  ?Allergen Reactions  ? Contrast Media [Iodinated Contrast Media] Anaphylaxis  ? Ciprofloxacin Hives  ? Latex Hives  ? Penicillins Hives  ? ? ?Medications: I have reviewed the patient's current medications. ? ?Results for orders placed or performed during the hospital encounter of 05/01/21 (from the past 48 hour(s))  ?Ethanol     Status: None  ? Collection Time: 05/01/21 12:53 PM  ?Result Value Ref Range  ? Alcohol, Ethyl (B) <10 <10 mg/dL  ?  Comment: (NOTE) ?Lowest detectable limit for serum alcohol is 10 mg/dL. ? ?For medical purposes only. ?Performed at [facility], [address] ?[place], [place] [address] ?  ?Protime-INR     Status: None  ? Collection Time: 05/01/21 12:53 PM  ?Result Value Ref Range  ? Prothrombin Time 13.4 11.4 - 15.2 seconds  ? INR 1.0 0.8 - 1.2  ?  Comment: (NOTE) ?INR goal varies based on device and disease states. ?Performed at [facility], [address] ?[place], [place] [address] ?  ?APTT     Status: None  ? Collection Time: 05/01/21 12:53 PM  ?Result Value Ref Range  ? aPTT 27 24 -  36 seconds  ?  Comment: Performed at [facility], [address] [place], [place] [address]  ?CBC     Status: Abnormal  ? Collection Time: 05/01/21 12:53 PM  ?Result Value Ref Range  ? WBC 11.8 (H) 4.0 - 10.5 K/uL  ? RBC 4.84 4.22 - 5.81 MIL/uL  ? Hemoglobin 14.3 13.0 - 17.0 g/dL  ? HCT 43.3 39.0 - 52.0 %  ? MCV 89.5 80.0 - 100.0 fL  ? MCH 29.5 26.0 - 34.0 pg  ? MCHC 33.0 30.0 - 36.0 g/dL  ? RDW 14.2 11.5 - 15.5 %  ? Platelets 288 150 - 400 K/uL  ? nRBC 0.0 0.0 - 0.2 %  ?  Comment: Performed at [facility], [address] [place], [place] [address]   ?Differential     Status: Abnormal  ? Collection Time: 05/01/21 12:53 PM  ?Result Value Ref Range  ? Neutrophils Relative % 75 %  ? Neutro Abs 8.7 (H) 1.7 - 7.7 K/uL  ? Lymphocytes Relative 16 %  ? Lymphs Abs 1.9 0.7 - 4.0 K/uL  ? Monocytes Relative 8 %  ? Monocytes Absolute 1.0 0.1 - 1.0 K/uL  ? Eosinophils Relative 1 %  ? Eosinophils Absolute 0.1 0.0 - 0.5 K/uL  ? Basophils Relative 0 %  ? Basophils Absolute 0.0 0.0 - 0.1 K/uL  ? Immature Granulocytes 0 %  ? Abs Immature Granulocytes 0.05 0.00 - 0.07 K/uL  ?  Comment: Performed at [facility], [address] [place], [place] [address]  ?Comprehensive metabolic panel     Status: Abnormal  ? Collection Time: 05/01/21 12:53 PM  ?Result Value Ref Range  ? Sodium 128 (L) 135 - 145 mmol/L  ? Potassium 4.7 3.5 - 5.1 mmol/L  ? Chloride 96 (L) 98 - 111 mmol/L  ? CO2 24 22 - 32 mmol/L  ? Glucose, Bld 400 (H) 70 - 99 mg/dL  ?  Comment: Glucose reference range applies only to samples taken after fasting for at least 8 hours.  ? BUN 17 8 - 23 mg/dL  ? Creatinine, Ser 0.94 0.61 - 1.24 mg/dL  ? Calcium 8.7 (L) 8.9 - 10.3 mg/dL  ? Total Protein 6.8 6.5 - 8.1 g/dL  ? Albumin 3.2 (L) 3.5 - 5.0 g/dL  ? AST 12 (L) 15 - 41 U/L  ? ALT 11 0 - 44 U/L  ? Alkaline Phosphatase 47 38 - 126 U/L  ? Total Bilirubin 0.7 0.3 - 1.2 mg/dL  ? GFR, Estimated >60 >60 mL/min  ?  Comment: (NOTE) ?Calculated using the CKD-EPI Creatinine Equation (2021) ?  ? Anion gap 8 5 - 15  ?  Comment: Performed at [facility], [address] [place], [place] [address]  ?Lactic acid, plasma     Status: None  ? Collection Time: 05/01/21 12:54 PM  ?Result Value Ref Range  ? Lactic Acid, Venous 1.7 0.5 - 1.9 mmol/L  ?  Comment: Performed at [facility], [address] [place], [place] [address]  ?I-stat chem 8, ED     Status: Abnormal  ? Collection Time: 05/01/21  1:02 PM  ?Result Value Ref Range  ? Sodium 129 (L) 135 - 145 mmol/L  ? Potassium 5.0 3.5 - 5.1  mmol/L  ? Chloride 94 (L) 98 - 111 mmol/L  ? BUN 18 8 - 23 mg/dL  ? Creatinine, Ser 0.80 0.61 - 1.24 mg/dL  ? Glucose, Bld 418 (H)  70 - 99 mg/dL  ?  Comment: Glucose reference range applies only to samples taken after fasting for at least 8 hours.  ? Calcium, Ion 1.14 (L) 1.15 - 1.40 mmol/L  ? TCO2 27 22 - 32 mmol/L  ? Hemoglobin 15.6 13.0 - 17.0 g/dL  ? HCT 46.0 39.0 - 52.0 %  ? ? ?CT Head Wo Contrast ? ?Result Date: 05/01/2021 ?CLINICAL DATA:  Transient ischemic attack.  Right arm numbness. EXAM: CT HEAD WITHOUT CONTRAST TECHNIQUE: Contiguous axial images were obtained from the base of the skull through the vertex without intravenous contrast. RADIATION DOSE REDUCTION: This exam was performed according to the departmental dose-optimization program which includes automated exposure control, adjustment of the mA and/or kV according to patient size and/or use of iterative reconstruction technique. COMPARISON:  CT head dated May 29, 2007 FINDINGS: Brain: No evidence of acute infarction, hemorrhage, hydrocephalus, extra-axial collection or mass lesion/mass effect. Vascular: No hyperdense vessel or unexpected calcification. Skull: Normal. Negative for fracture or focal lesion. Sinuses/Orbits: No acute finding. Other: None. IMPRESSION: No acute intracranial abnormality. MRI examination could be considered for further evaluation if clinically warranted. Electronically Signed   By: Larose Hires D.O.   On: 05/01/2021 13:20  ? ?DG Chest Portable 1 View ? ?Result Date: 05/01/2021 ?CLINICAL DATA:  COPD.  Arm numbness. EXAM: PORTABLE CHEST 1 VIEW COMPARISON:  April 27, 2021 FINDINGS: Cardiomediastinal silhouette is normal. Mediastinal contours appear intact. Likely exaggerated contour of the ascending aorta, likely projectional, as it looked normal on April 27, 2021. There is no evidence of focal airspace consolidation, pleural effusion or pneumothorax. Osseous structures are without acute abnormality. Soft tissues are grossly  normal. IMPRESSION: 1. No active disease. 2. Likely exaggerated contour of the ascending aorta, likely projectional. Recommend follow-up with PA and lateral radiograph of the chest to exclude aortic pathology. Electr

## 2021-05-01 NOTE — ED Notes (Signed)
Patient transported to MRI 

## 2021-05-01 NOTE — ED Notes (Signed)
Eyad Ahlers [phone], requesting call when patient is transferred. ?

## 2021-05-01 NOTE — ED Notes (Signed)
Patient transported to CT 

## 2021-05-01 NOTE — ED Triage Notes (Addendum)
Pt presents with c/o right arm numbness that started last night. Pt's family reported that they started noticing he was altered last night before bedtime, approx 9 pm. Pt has a right pronator drift and some decreased vision as well as some decreased grip strength. Pt also c/o left knee pain, recently knee surgery several days ago in this hospital. Pt's knee is very red and swollen, family reports hx of cellulitis.  ?

## 2021-05-01 NOTE — H&P (Signed)
? ? ?History and Physical ? ?Craig Bauer PPJ:[id number] DOB: 01-09-1951 DOA: 05/01/2021 ? ?PCP: Courtney Heys, PA-C ?Patient coming from: Home ? ?I have personally briefly reviewed patient's old medical records in [facility] ? ? ?Chief Complaint: Knee pain and numbness ? ?HPI: Goldman Birchall is a 71 y.o. 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  Most of the history is obtained from the chart as the patient could not provide a good history due to his somnolence and did not get in contact with the family the patient is also slow to respond able to answer simple questions, no slurred speech or facial droop. ? ?In the ED: ?Found to be afebrile, with a white count of 12, orthopedic surgery was consulted, mildly hyponatremic at 129 blood glucose was 400 lactic acid of 1.7, secondary to influenza PCR were negative.  CT of the head showed no acute abnormalities, knee x-ray showed left total knee arthroplasty, there is some fragmentation along the peripheral aspect of the medial femoral condyle I, there is moderate knee effusion and soft tissue swelling.  Chest x-ray showed no active disease.  MRI of the brain showed no evidence of acute intracranial abnormalities but did show chronic small vessel ischemic changes which are moderate small mucous retention cyst.  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 ? ? ?Review of Systems: All systems reviewed and apart from history of presenting illness, are  negative. ? ?Past Medical History:  ?Diagnosis Date  ? Arthritis   ? Blood clot in vein   ? COPD (chronic obstructive pulmonary disease) (HCC)   ? Diabetes mellitus without complication (HCC)   ? Headache   ? migraine  ? Hypertension   ? Sleep apnea   ? ?Past Surgical History:  ?Procedure Laterality Date  ? HAND SURGERY Left   ? HERNIA REPAIR    ? SHOULDER ARTHROSCOPY W/ ROTATOR CUFF REPAIR Bilateral   ? TOTAL KNEE ARTHROPLASTY Right 09/29/2020  ? Procedure: RIGHT TOTAL KNEE ARTHROPLASTY;  Surgeon: Marcene Corning, MD;  Location: [facility];  Service: Orthopedics;  Laterality: Right;  ? TOTAL KNEE ARTHROPLASTY Left 04/27/2021  ? Procedure: LEFT TOTAL KNEE ARTHROPLASTY;  Surgeon: Marcene Corning, MD;  Location: [facility];  Service: Orthopedics;  Laterality: Left;  ? TOTAL SHOULDER ARTHROPLASTY Bilateral   ? ?Social History:  reports that he quit smoking about 7 months ago. His smoking use included cigarettes. He has never used smokeless tobacco. He reports that he does not currently use alcohol. He reports that he does not use drugs. ? ? ?Allergies  ?Allergen Reactions  ? Contrast Media [Iodinated Contrast Media] Anaphylaxis  ? Ciprofloxacin Hives  ? Latex Hives  ? Penicillins Hives  ? ? ?History reviewed. No pertinent family history.  ?Cannot obtain the history as the patient is slow to respond, family not at bedside. ? ?Prior to Admission medications   ?Medication Sig Start Date End Date Taking? Authorizing Provider  ?albuterol (VENTOLIN HFA) 108 (90 Base) MCG/ACT inhaler Inhale 2 puffs into the lungs  every 6 (six) hours as needed for wheezing or shortness of breath.    [provider]  ?cyclobenzaprine (FLEXERIL) 10 MG tablet Take 10 mg by mouth daily as needed for muscle spasms.    [provider]  ?dapagliflozin propanediol (FARXIGA) 5 MG TABS tablet Take 5 mg by mouth daily.    [provider]  ?diclofenac Sodium (VOLTAREN) 1 % GEL Apply 1 application topically 4 (four) times daily as needed  (pain).    [provider]  ?DULoxetine (CYMBALTA) 60 MG capsule Take 60 mg by mouth daily.    [provider]  ?furosemide (LASIX) 40 MG tablet Take 80 mg by mouth 2 (two) times daily. 05/13/20   [provider]  ?gabapentin (NEURONTIN) 800 MG tablet Take 800 mg by mouth 3 (three) times daily. 05/11/20   [provider]  ?insulin glargine (LANTUS SOLOSTAR) 100 UNIT/ML Solostar Pen Inject 26 Units into the skin at bedtime.    [provider]  ?Lidocaine 2 % GEL Apply 1 application topically daily as needed (pain).    [provider]  ?metFORMIN (GLUCOPHAGE) 500 MG tablet Take 500 mg by mouth 2 (two) times daily. 05/13/20   [provider]  ?mirtazapine (REMERON) 30 MG tablet Take 45 mg by mouth at bedtime. 05/11/20   [provider]  ?mupirocin ointment (BACTROBAN) 2 % Apply 1 application. topically 3 (three) times daily. 04/07/21   [provider]  ?omeprazole (PRILOSEC) 40 MG capsule Take 40 mg by mouth daily. 05/13/20   [provider]  ?oxyCODONE 10 MG TABS Take 0.5-1 tablets (5-10 mg total) by mouth every 8 (eight) hours as needed for breakthrough pain (in addition to baseline meds for post op pain). 04/27/21 04/27/22  [name], PA-C  ?Oxycodone HCl 20 MG TABS Take 20 mg by mouth 4 (four) times daily as needed (pain).    [provider]  ?potassium chloride SA (KLOR-CON) 20 MEQ tablet Take 40 mEq by mouth 3 (three) times daily. 03/23/20   [provider]  ?pravastatin (PRAVACHOL) 40 MG tablet Take 40 mg by mouth at bedtime.    [provider]  ?rivaroxaban (XARELTO) 20 MG TABS tablet Take 20 mg by mouth daily.    [provider]  ?rOPINIRole (REQUIP) 2 MG tablet Take 2 mg by mouth at bedtime.    [provider]  ?sitaGLIPtin (JANUVIA) 100 MG tablet Take 100 mg by mouth daily.    [provider]  ?SUMAtriptan (IMITREX) 100 MG tablet Take 100 mg by mouth every 2 (two) hours as  needed for migraine. May repeat in 2 hours if headache persists or recurs.    [provider]  ?tirzepatide Greggory Keen) 5 MG/0.5ML Pen Inject 5 mg into the skin every Friday.    [provider]  ?topiramate (TOPAMAX) 200 MG tablet Take 200-400 mg by mouth See admin instructions. Take 200 mg in the morning and 400 mg at bedtime    [provider]  ?XTAMPZA ER 36 MG C12A Take 36 mg by mouth every 8 (eight) hours. 05/18/20   [provider]  ? ?Physical Exam: ?Vitals:  ? 05/01/21 1624 05/01/21 1630 05/01/21 1715 05/01/21 1745  ?BP:  119/60 125/60 125/60  ?Pulse: 80 89 79 75  ?Resp: 16 18 20 17   ?Temp:      ?TempSrc:      ?SpO2: 94% 94% 98% 99%  ? ? ?General exam: Moderately built and nourished patient, lying comfortably supine position but somnolent  able to answer questions but slow to respond ?Head, eyes and ENT: Nontraumatic and normocephalic. ?Neck: Supple. No JVD, carotid bruit or thyromegaly. ?Lymphatics: No lymphadenopathy. ?Respiratory system: Clear to auscultation. No increased work of breathing. ?Cardiovascular system: S1 and S2 heard, RRR.  ?Gastrointestinal system: Abdomen is nondistended, soft and nontender.  ?Central nervous system: Alert and oriented x3 moving all 4 extremities without any difficulties ?Extremities: Pulses are symmetrical strength is about 5 in all 4 extremities.  3+ edema bilaterally ?Skin: Erythema above the left ankle and below the knee ?Musculoskeletal system: Negative exam. ? ? ?Labs on Admission:  ?Basic Metabolic Panel: ?Recent Labs  ?Lab 05/01/21 ?1253 05/01/21 ?1302  ?NA 128* 129*  ?K 4.7 5.0  ?CL 96* 94*  ?CO2 24  --   ?GLUCOSE 400* 418*  ?BUN 17 18  ?CREATININE 0.94 0.80  ?CALCIUM 8.7*  --   ? ?Liver Function Tests: ?Recent Labs  ?Lab 05/01/21 ?1253  ?AST 12*  ?ALT 11  ?ALKPHOS 47  ?BILITOT 0.7  ?PROT 6.8  ?ALBUMIN 3.2*  ? ?No results for input(s): LIPASE, AMYLASE in the last 168 hours. ?No results for input(s): AMMONIA in the last 168  hours. ?CBC: ?Recent Labs  ?Lab 05/01/21 ?1253 05/01/21 ?1302  ?WBC 11.8*  --   ?NEUTROABS 8.7*  --   ?HGB 14.3 15.6  ?HCT 43.3 46.0  ?MCV 89.5  --   ?PLT 288  --   ? ?Cardiac Enzymes: ?No results for input(s): CKTO

## 2021-05-01 NOTE — ED Provider Triage Note (Signed)
Emergency Medicine Provider Triage Evaluation Note ? ?Craig Bauer , a 71 y.o. male  was evaluated in triage.  Pt complains of right arm numbness that he noticed at 3 AM this morning.  Patient's wife is at bedside and noticed that his last known normal time was 8 PM last night prior to going to bed.  Patient also notes that he had blurred vision on the way to the ED that has resolved at this time.  Patient is currently on Xarelto and he started his Xarelto back on 04/28/2021. ? ? ?Patient also notes that he is having increased left knee pain, redness, swelling status post left knee total replacement on 04/27/2021. ? ? ? ?Review of Systems  ?Positive: As per HPI above ?Negative:  ? ?Physical Exam  ?BP 113/70 (BP Location: Left Arm)   Pulse (!) 106   Temp 99 ?F (37.2 ?C) (Oral)   Resp 17   SpO2 93%  ?Gen:   Awake, no distress   ?Resp:  Normal effort  ?MSK:   Moves extremities without difficulty.  Increased warmth, erythema, swelling noted to left knee.  Surgical bandages in place (surgical bandages not removed in triage). ?Other:  Positive pronator drift to the right arm.  Decreased grip strength to the right arm.  No visual field deficits on my exam. Alert and oriented x4 ? ?Medical Decision Making  ?Medically screening exam initiated at 12:23 PM.  Appropriate orders placed.  Craig Bauer was informed that the remainder of the evaluation will be completed by another provider, this initial triage assessment does not replace that evaluation, and the importance of remaining in the ED until their evaluation is complete. ? ?12:40 PM -Case discussed with attending who agrees with treatment plan. ? ?12:46 PM - Discussed with RN that patient is in need of a room immediately due to concerns for TIA. RN aware and working on room placement.  ?  ?Craig Bauer A, PA-C ?05/01/21 1247 ? ?

## 2021-05-01 NOTE — ED Provider Notes (Signed)
Care was taken over from Dr. Lynelle Doctor.  Patient and presented with 2 issues.  1 is potential infection of his left knee status post total knee replacement April 18.  Ortho has seen the patient and recommended IV antibiotics.  They have started him on Ancef.  He also presents with right arm numbness and weakness.  No leg involvement.  No facial involvement.  MRI of the brain showed no evidence of stroke.  MRI of the cervical spine shows foraminal stenosis and canal stenosis which looks like some acute on chronic findings.  Neurosurgery was consulted.  I spoke with APP Bergman.  She advises to start steroids when patient is able.  No emergent surgery indicated per her report.  I spoke with Dr. Robb Matar he will admit the patient for further treatment. ?  ?Rolan Bucco, MD ?05/01/21 1757 ? ?

## 2021-05-02 ENCOUNTER — Inpatient Hospital Stay (HOSPITAL_COMMUNITY): Payer: Medicare HMO

## 2021-05-02 ENCOUNTER — Other Ambulatory Visit: Payer: Self-pay

## 2021-05-02 DIAGNOSIS — G9341 Metabolic encephalopathy: Secondary | ICD-10-CM | POA: Diagnosis not present

## 2021-05-02 DIAGNOSIS — L03116 Cellulitis of left lower limb: Secondary | ICD-10-CM | POA: Diagnosis not present

## 2021-05-02 DIAGNOSIS — R739 Hyperglycemia, unspecified: Secondary | ICD-10-CM | POA: Diagnosis not present

## 2021-05-02 DIAGNOSIS — R600 Localized edema: Secondary | ICD-10-CM

## 2021-05-02 DIAGNOSIS — A4189 Other specified sepsis: Secondary | ICD-10-CM | POA: Diagnosis not present

## 2021-05-02 DIAGNOSIS — L03115 Cellulitis of right lower limb: Secondary | ICD-10-CM

## 2021-05-02 LAB — ECHOCARDIOGRAM COMPLETE
AR max vel: 5.28 cm2
AV Area VTI: 5.44 cm2
AV Area mean vel: 5.33 cm2
AV Mean grad: 3 mmHg
AV Peak grad: 5.3 mmHg
Ao pk vel: 1.15 m/s
Area-P 1/2: 3.27 cm2
S' Lateral: 3.5 cm

## 2021-05-02 LAB — CBC
HCT: 42.2 % (ref 39.0–52.0)
Hemoglobin: 13.5 g/dL (ref 13.0–17.0)
MCH: 29.2 pg (ref 26.0–34.0)
MCHC: 32 g/dL (ref 30.0–36.0)
MCV: 91.1 fL (ref 80.0–100.0)
Platelets: 214 10*3/uL (ref 150–400)
RBC: 4.63 MIL/uL (ref 4.22–5.81)
RDW: 14.3 % (ref 11.5–15.5)
WBC: 9.1 10*3/uL (ref 4.0–10.5)
nRBC: 0 % (ref 0.0–0.2)

## 2021-05-02 LAB — GLUCOSE, CAPILLARY
Glucose-Capillary: 196 mg/dL — ABNORMAL HIGH (ref 70–99)
Glucose-Capillary: 135 mg/dL — ABNORMAL HIGH (ref 70–99)
Glucose-Capillary: 236 mg/dL — ABNORMAL HIGH (ref 70–99)
Glucose-Capillary: 197 mg/dL — ABNORMAL HIGH (ref 70–99)
Glucose-Capillary: 142 mg/dL — ABNORMAL HIGH (ref 70–99)

## 2021-05-02 LAB — BASIC METABOLIC PANEL
Anion gap: 7 (ref 5–15)
BUN: 13 mg/dL (ref 8–23)
CO2: 25 mmol/L (ref 22–32)
Calcium: 8.5 mg/dL — ABNORMAL LOW (ref 8.9–10.3)
Chloride: 102 mmol/L (ref 98–111)
Creatinine, Ser: 0.76 mg/dL (ref 0.61–1.24)
GFR, Estimated: >60 mL/min (ref >60)
Glucose, Bld: 180 mg/dL — ABNORMAL HIGH (ref 70–99)
Potassium: 4.5 mmol/L (ref 3.5–5.1)
Sodium: 134 mmol/L — ABNORMAL LOW (ref 135–145)

## 2021-05-02 LAB — HIV ANTIBODY (ROUTINE TESTING W REFLEX): HIV Screen 4th Generation wRfx: NONREACTIVE

## 2021-05-02 MED ORDER — MORPHINE SULFATE (PF) 2 MG/ML IV SOLN
0.5000 mg | Freq: Once | INTRAVENOUS | Status: AC
  Administered 2021-05-02: 0.5 mg via INTRAVENOUS
  Filled 2021-05-02: qty 1

## 2021-05-02 MED ORDER — MELATONIN 3 MG PO TABS
3.0000 mg | ORAL_TABLET | Freq: Once | ORAL | Status: AC
  Administered 2021-05-02: 3 mg via ORAL
  Filled 2021-05-02: qty 1

## 2021-05-02 MED ORDER — INSULIN ASPART 100 UNIT/ML IJ SOLN
4.0000 [IU] | Freq: Three times a day (TID) | INTRAMUSCULAR | Status: DC
  Administered 2021-05-02 – 2021-05-03 (×5): 4 [IU] via SUBCUTANEOUS

## 2021-05-02 MED ORDER — OXYCODONE HCL 5 MG PO TABS
5.0000 mg | ORAL_TABLET | Freq: Three times a day (TID) | ORAL | Status: DC | PRN
  Administered 2021-05-02: 10 mg via ORAL
  Administered 2021-05-02: 5 mg via ORAL
  Administered 2021-05-03 (×2): 10 mg via ORAL
  Filled 2021-05-02: qty 2
  Filled 2021-05-02: qty 1
  Filled 2021-05-02 (×3): qty 2

## 2021-05-02 MED ORDER — KETOROLAC TROMETHAMINE 15 MG/ML IJ SOLN
7.5000 mg | Freq: Once | INTRAMUSCULAR | Status: AC
  Administered 2021-05-02: 7.5 mg via INTRAVENOUS
  Filled 2021-05-02: qty 1

## 2021-05-02 MED ORDER — INSULIN ASPART 100 UNIT/ML IJ SOLN: 0.0000 [IU] | Freq: Every day | INTRAMUSCULAR | Status: DC

## 2021-05-02 MED ORDER — ATORVASTATIN CALCIUM 40 MG PO TABS
40.0000 mg | ORAL_TABLET | Freq: Every day | ORAL | Status: DC
  Administered 2021-05-02: 40 mg via ORAL
  Filled 2021-05-02: qty 1

## 2021-05-02 MED ORDER — INSULIN ASPART 100 UNIT/ML IJ SOLN
0.0000 [IU] | Freq: Three times a day (TID) | INTRAMUSCULAR | Status: DC
  Administered 2021-05-02: 3 [IU] via SUBCUTANEOUS
  Administered 2021-05-02: 2 [IU] via SUBCUTANEOUS
  Administered 2021-05-02: 5 [IU] via SUBCUTANEOUS
  Administered 2021-05-03 (×2): 3 [IU] via SUBCUTANEOUS
  Administered 2021-05-03: 8 [IU] via SUBCUTANEOUS

## 2021-05-02 MED ORDER — LINAGLIPTIN 5 MG PO TABS
5.0000 mg | ORAL_TABLET | Freq: Every day | ORAL | Status: DC
Start: 2021-05-02 — End: 2021-05-03
  Administered 2021-05-02 – 2021-05-03 (×2): 5 mg via ORAL
  Filled 2021-05-02 (×2): qty 1

## 2021-05-02 MED ORDER — POTASSIUM CHLORIDE CRYS ER 20 MEQ PO TBCR
40.0000 meq | EXTENDED_RELEASE_TABLET | Freq: Every day | ORAL | Status: DC
  Administered 2021-05-02 – 2021-05-03 (×2): 40 meq via ORAL
  Filled 2021-05-02 (×2): qty 2

## 2021-05-02 NOTE — TOC Initial Note (Signed)
Transition of Care (TOC) - Initial/Assessment Note  ? ? ?Patient Details  ?Name: Craig Bauer ?MRN: [id number] ?Date of Birth: 1950/12/01 ? ?Transition of Care (TOC) CM/SW Contact:    ?Cecille Po, RN ?Phone Number: ?05/02/2021, 1:51 PM ? ?Clinical Narrative:                 ? ?From home with spouse.  Patient had [facility] on 4/18.  Pending PT eval to help determine any discharge needs. ?TOC following.  ? ?Expected Discharge Plan: Home w Home Health Services ?Barriers to Discharge: Continued Medical Work up ? ? ?Expected Discharge Plan and Services ?Expected Discharge Plan: Home w Home Health Services ?  ?  ?  ?Living arrangements for the past 2 months: Single Family Home ?                ?  ?Prior Living Arrangements/Services ?Living arrangements for the past 2 months: Single Family Home ?Lives with:: Spouse ?Patient language and need for interpreter reviewed:: Yes ?       ?Need for Family Participation in Patient Care: Yes (Comment) ?Care giver support system in place?: Yes (comment) ?  ?Criminal Activity/Legal Involvement Pertinent to Current Situation/Hospitalization: No - Comment as needed ? ?Activities of Daily Living ?Home Assistive Devices/Equipment: Jeannette How (specify type) ?ADL Screening (condition at time of admission) ?Patient's cognitive ability adequate to safely complete daily activities?: Yes ?Is the patient deaf or have difficulty hearing?: No ?Does the patient have difficulty seeing, even when wearing glasses/contacts?: No ?Does the patient have difficulty concentrating, remembering, or making decisions?: No ?Patient able to express need for assistance with ADLs?: Yes ?Does the patient have difficulty dressing or bathing?: No ?Independently performs ADLs?: No ?Communication: Independent ?Dressing (OT): Independent ?Grooming: Independent ?Feeding: Independent ?Bathing: Needs assistance ?Is this a change from baseline?: Pre-admission baseline ?Toileting: Needs assistance ?Is this a change  from baseline?: Pre-admission baseline ?In/Out Bed: Needs assistance ?Is this a change from baseline?: Pre-admission baseline ?Walks in Home: Needs assistance ?Is this a change from baseline?: Pre-admission baseline ?Does the patient have difficulty walking or climbing stairs?: Yes ?Weakness of Legs: Left ?Weakness of Arms/Hands: None ? ?Emotional Assessment ?  ?Alcohol / Substance Use: Not Applicable ?Psych Involvement: No (comment) ? ?Admission diagnosis:  Knee swelling [M25.469] ?Hyperglycemia [R73.9] ?Lower extremity cellulitis [L03.119] ?Cellulitis of right lower leg [L03.115] ?Weakness of extremity [R29.898] ?Patient Active Problem List  ? Diagnosis Date Noted  ? Lower extremity cellulitis 05/01/2021  ? Acute metabolic encephalopathy 05/01/2021  ? Sepsis (HCC) 05/01/2021  ? Right arm weakness 05/01/2021  ? Cellulitis of right lower leg 05/01/2021  ? Hyponatremia 05/01/2021  ? Uncontrolled type 2 diabetes mellitus with hyperglycemia, with long-term current use of insulin (HCC) 05/01/2021  ? Bilateral lower extremity edema 05/01/2021  ? Primary osteoarthritis of left knee 04/27/2021  ? Primary localized osteoarthritis of right knee 09/29/2020  ? Primary osteoarthritis of right knee 09/29/2020  ? ?PCP:  Courtney Heys, PA-C ?Pharmacy:   ?[facility] [place], [place] - [phone] - [place], [place] - [address] ?[address] ?[place] [address] ?Phone: [phone] Fax: [phone] ? ? ?

## 2021-05-02 NOTE — Plan of Care (Signed)
?  Problem: Skin Integrity: ?Goal: Demonstration of wound healing without infection will improve ?05/02/2021 0730 by Beverly Sessions, RN ?Outcome: Progressing ?05/02/2021 0730 by Beverly Sessions, RN ?Outcome: Progressing ?  ?Problem: Education: ?Goal: Knowledge of General Education information will improve ?Description: Including pain rating scale, medication(s)/side effects and non-pharmacologic comfort measures ?Outcome: Progressing ?  ?Problem: Clinical Measurements: ?Goal: Ability to maintain clinical measurements within normal limits will improve ?Outcome: Progressing ?  ?

## 2021-05-02 NOTE — Evaluation (Signed)
Occupational Therapy Evaluation ?Patient Details ?Name: Craig Bauer ?MRN: [id number] ?DOB: 03/25/1950 ?Today's Date: 05/02/2021 ? ? ?History of Present Illness Pt admitted from home with L knee pain and dx with severe sepsis 2* L LE cellulitis; acute metabolic encephalopathy, hypovolemic hyponatremia, and hyperglycemia.  Pt wtih hx of DM, COPD, bil TSR, and bil TKR with L TKR 04/25/21  ? ?Clinical Impression ?  ?Mr. Kenrick Pore is a 71 year old with above medical history. He presents with decreased ROM and strength of left knee and pain. Overall he is min guard for ambulation and min assist for LB ADLs. He is mostly limited by pani which should improve with medical management. He has assistance of his wife at home if needed. From an OT standpoint he has no acute care OT needs.   ?   ? ?Recommendations for follow up therapy are one component of a multi-disciplinary discharge planning process, led by the attending physician.  Recommendations may be updated based on patient status, additional functional criteria and insurance authorization.  ? ?Follow Up Recommendations ? No OT follow up  ?  ?Assistance Recommended at Discharge Intermittent Supervision/Assistance  ?Patient can return home with the following A little help with bathing/dressing/bathroom ? ?  ?Functional Status Assessment ? Patient has had a recent decline in their functional status and demonstrates the ability to make significant improvements in function in a reasonable and predictable amount of time.  ?Equipment Recommendations ? None recommended by OT  ?  ?Recommendations for Other Services   ? ? ?  ?Precautions / Restrictions Precautions ?Precautions: Knee;Fall ?Restrictions ?Weight Bearing Restrictions: No ?Other Position/Activity Restrictions: WBAT  ? ?  ? ?Mobility Bed Mobility ?Overal bed mobility: Modified Independent ?  ?  ?  ?  ?  ?  ?  ?  ? ?Transfers ?Overall transfer level: Needs assistance ?Equipment used: Rolling walker (2  wheels) ?Transfers: Sit to/from Stand ?Sit to Stand: Min guard ?  ?  ?  ?  ?  ?General transfer comment: Steady assist with cues for use of UEs to self assist ?  ? ?  ?Balance Overall balance assessment: Needs assistance ?Sitting-balance support: Feet supported, No upper extremity supported ?Sitting balance-Leahy Scale: Good ?  ?  ?Standing balance support: Reliant on assistive device for balance, During functional activity, Bilateral upper extremity supported ?Standing balance-Leahy Scale: Poor ?Standing balance comment: Upon initial standing, demonstrated anterior lean that was corrected with cuing. ?  ?  ?  ?  ?  ?  ?  ?  ?  ?  ?  ?   ? ?ADL either performed or assessed with clinical judgement  ? ?ADL Overall ADL's : At baseline ?  ?  ?  ?  ?  ?  ?  ?  ?  ?  ?  ?  ?  ?  ?  ?  ?  ?  ?  ?General ADL Comments: At recent baseline - limited by pain and decreased ROM. Min assist for LB ADLs  ? ? ? ?Vision Patient Visual Report: No change from baseline ?   ?   ?Perception   ?  ?Praxis   ?  ? ?Pertinent Vitals/Pain Pain Assessment ?Pain Assessment: 0-10 ?Pain Score: 10-Worst pain ever ?Pain Location: L knee with mobility ?Pain Descriptors / Indicators: Aching, Grimacing, Throbbing ?Pain Intervention(s): Limited activity within patient's tolerance  ? ? ? ?Hand Dominance Right ?  ?Extremity/Trunk Assessment Upper Extremity Assessment ?Upper Extremity Assessment: RUE deficits/detail;LUE deficits/detail ?RUE Deficits / Details:  WFL ROM, 4/5 shoulder, 4/5 elbow, 4/5 wrist, 3+/5 grip ?RUE Sensation: decreased light touch ?RUE Coordination: WNL ?LUE Deficits / Details: WFL ROM, 5/5 strength ?LUE Sensation: WNL ?LUE Coordination: WNL ?  ?Lower Extremity Assessment ?Lower Extremity Assessment: Defer to PT evaluation ?LLE: Unable to fully assess due to pain ?  ?Cervical / Trunk Assessment ?Cervical / Trunk Assessment: Normal ?  ?Communication Communication ?Communication: No difficulties ?  ?Cognition Arousal/Alertness:  Awake/alert ?Behavior During Therapy: [facility] for tasks assessed/performed ?Overall Cognitive Status: Within Functional Limits for tasks assessed ?  ?  ?  ?  ?  ?  ?  ?  ?  ?  ?  ?  ?  ?  ?  ?  ?  ?  ?  ?General Comments    ? ?  ?Exercises   ?  ?Shoulder Instructions    ? ? ?Home Living Family/patient expects to be discharged to:: Private residence ?Living Arrangements: Spouse/significant other;Children ?Available Help at Discharge: Family ?Type of Home: Mobile home ?Home Access: Stairs to enter ?Entrance Stairs-Number of Steps: 4 ?Entrance Stairs-Rails: Right;Left;Can reach both ?Home Layout: One level ?  ?  ?Bathroom Shower/Tub: Tub/shower unit ?  ?Bathroom Toilet: Handicapped height ?Bathroom Accessibility: Yes ?  ?Home Equipment: Cane - single point;Rollator (4 wheels);Rolling Walker (2 wheels) ?  ?  ?  ? ?  ?Prior Functioning/Environment Prior Level of Function : Independent/Modified Independent ?  ?  ?  ?  ?  ?  ?  ?ADLs Comments: had assistance of wife if needed - but was doing well before pain got worse ?  ? ?  ?  ?OT Problem List: Decreased strength;Decreased range of motion;Pain ?  ?   ?OT Treatment/Interventions:    ?  ?OT Goals(Current goals can be found in the care plan section) Acute Rehab OT Goals ?OT Goal Formulation: All assessment and education complete, DC therapy  ?OT Frequency:   ?  ? ?Co-evaluation   ?  ?  ?  ?  ? ?  ?AM-PAC OT "6 Clicks" Daily Activity     ?Outcome Measure Help from another person eating meals?: None ?Help from another person taking care of personal grooming?: None ?Help from another person toileting, which includes using toliet, bedpan, or urinal?: None ?Help from another person bathing (including washing, rinsing, drying)?: A Little ?Help from another person to put on and taking off regular upper body clothing?: None ?Help from another person to put on and taking off regular lower body clothing?: A Little ?6 Click Score: 22 ?  ?End of Session Equipment Utilized During Treatment:  Rolling walker (2 wheels) ?Nurse Communication: Mobility status ? ?Activity Tolerance: Patient limited by pain ?Patient left: in chair;with call bell/phone within reach ? ?OT Visit Diagnosis: Pain ?Pain - Right/Left: Left ?Pain - part of body: Knee  ?              ?Time: 0867-6195 ?OT Time Calculation (min): 14 min ?Charges:  OT General Charges ?$OT Visit: 1 Visit ?OT Evaluation ?$OT Eval Low Complexity: 1 Low ? ?Alianny Toelle, OTR/L ?Acute Care Rehab Services  ?Office [phone] ?Pager: [phone]  ? ?Avneet Ashmore L Sarayu Prevost ?05/02/2021, 3:58 PM ?

## 2021-05-02 NOTE — Progress Notes (Signed)
PATIENT ID: Craig Bauer  MRN: [id number]  DOB/AGE:  05/18/1950 / 71 y.o.  ?   ? ?Subjective: ?Patient sleeping in room. Patient did awaken but would not communicate how the left leg was feeling. Minimal pain response to palpation and passive ROM of the LLE.  ? ?Objective: ?Vital signs in last 24 hours: ?Temp:  [98.2 ?F (36.8 ?C)-99.5 ?F (37.5 ?C)] 98.8 ?F (37.1 ?C) (04/23 0606) ?Pulse Rate:  [75-106] 78 (04/23 0606) ?Resp:  [16-26] 18 (04/23 0606) ?BP: (109-128)/(55-72) 114/68 (04/23 0606) ?SpO2:  [87 %-99 %] 93 % (04/23 0606) ? ?Intake/Output from previous day: ?04/22 0701 - 04/23 0700 ?In: 546.8 [IV Piggyback:546.8] ?Out: 1450 [Urine:1450] ? ?Recent Labs  ?  05/01/21 ?1253 05/01/21 ?1302 05/02/21 ?0429  ?HGB 14.3 15.6 13.5  ? ?Recent Labs  ?  05/01/21 ?1253 05/01/21 ?1302 05/02/21 ?0429  ?WBC 11.8*  --  9.1  ?RBC 4.84  --  4.63  ?HCT 43.3 46.0 42.2  ?PLT 288  --  214  ? ?Recent Labs  ?  05/01/21 ?1253 05/01/21 ?1302 05/02/21 ?0429  ?NA 128* 129* 134*  ?K 4.7 5.0 4.5  ?CL 96* 94* 102  ?CO2 24  --  25  ?BUN 17 18 13   ?CREATININE 0.94 0.80 0.76  ?GLUCOSE 400* 418* 180*  ?CALCIUM 8.7*  --  8.5*  ? ?Recent Labs  ?  05/01/21 ?1253  ?INR 1.0  ? ? ?Physical Exam: ?Mild erythema and swelling extending beyond the borders aquacel dressing over the left knee. Generalized tenderness about the left knee. No open wounds or drainage noted. More significant edema and erythema noted in the left lower leg, but does appears slightly improved from yesterday. Calf is soft and nontender. Dorsiflexion and plantar flexion intact in LE. Sensation to light touch intact in LE.   ? ?Assessment/Plan: ?5 days S/P left total knee arthroplasty ?LLE cellulitis ? ?Erythema and swelling appears slightly reduced this morning. Will continue to monitor WBC. Down to 9.1 from 11.8. No results from blood cultures yet. No need for aspiration or I&D at this point. 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 Patient started on steroids. If patient is stable enough from medical standpoint, he could work with PT.  ?  ? ? ?Craig Bauer L. [place], PA-C ?05/02/2021, 7:49 AM  ? ?  ?

## 2021-05-02 NOTE — Evaluation (Signed)
Physical Therapy Evaluation ?Patient Details ?Name: Craig Bauer ?MRN: [id number] ?DOB: 02/23/1950 ?Today's Date: 05/02/2021 ? ?History of Present Illness ? Pt admitted from home with L knee pain and dx with severe sepsis 2* L LE cellulitis; acute metabolic encephalopathy, hypovolemic hyponatremia, and hyperglycemia.  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  ? Pt admitted as above and presenting with functional mobility limitations 2* decreased L LE strength/ROM and 10/10 L knee pain with attempts to mobilize.  Pt very motivated and up to ambulate limited distance into hall despite pain and hoping for dc home soon.  Pt would benefit from continuation of HHPT - had been receiving since L TKR 04/27/21/  ? ?Recommendations for follow up therapy are one component of a multi-disciplinary discharge planning process, led by the attending physician.  Recommendations may be updated based on patient status, additional functional criteria and insurance authorization. ? ?Follow Up Recommendations Home health PT (Pt was receiving HHPT since TKR 04/27/21) ? ?  ?Assistance Recommended at Discharge Intermittent Supervision/Assistance  ?Patient can return home with the following ? A little help with walking and/or transfers;A little help with bathing/dressing/bathroom;Assistance with cooking/housework;Assist for transportation;Help with stairs or ramp for entrance ? ?  ?Equipment Recommendations None recommended by PT  ?Recommendations for Other Services ?    ?  ?Functional Status Assessment Patient has had a recent decline in their functional status and demonstrates the ability to make significant improvements in function in a reasonable and predictable amount of time.  ? ?  ?Precautions / Restrictions Precautions ?Precautions: Knee;Fall ?Restrictions ?Weight Bearing Restrictions: No ?Other Position/Activity Restrictions: WBAT  ? ?  ? ?Mobility ? Bed Mobility ?Overal bed mobility:  Modified Independent ?  ?  ?  ?  ?  ?  ?General bed mobility comments: Increased time but no physical assist ?  ? ?Transfers ?Overall transfer level: Needs assistance ?Equipment used: Rolling walker (2 wheels) ?Transfers: Sit to/from Stand ?Sit to Stand: Min guard ?  ?  ?  ?  ?  ?General transfer comment: Steady assist with cues for use of UEs to self assist ?  ? ?Ambulation/Gait ?Ambulation/Gait assistance: Min guard ?Gait Distance (Feet): 38 Feet ?Assistive device: Rolling walker (2 wheels) ?Gait Pattern/deviations: Step-to pattern, Shuffle, Antalgic, Decreased stance time - left ?Gait velocity: decreased ?  ?  ?General Gait Details: min cues for posture and position from RW - distance ltd by pain ? ?Stairs ?  ?  ?  ?  ?  ? ?Wheelchair Mobility ?  ? ?Modified Rankin (Stroke Patients Only) ?  ? ?  ? ?Balance Overall balance assessment: Needs assistance ?Sitting-balance support: Feet supported, No upper extremity supported ?Sitting balance-Leahy Scale: Good ?  ?  ?Standing balance support: Reliant on assistive device for balance, During functional activity, Bilateral upper extremity supported ?Standing balance-Leahy Scale: Poor ?  ?  ?  ?  ?  ?  ?  ?  ?  ?  ?  ?  ?   ? ? ? ?Pertinent Vitals/Pain Pain Assessment ?Pain Assessment: 0-10 ?Pain Score: 10-Worst pain ever ?Pain Location: L knee with mobility ?Pain Descriptors / Indicators: Aching, Grimacing, Throbbing ?Pain Intervention(s): Limited activity within patient's tolerance, Monitored during session, Premedicated before session, Ice applied  ? ? ?Home Living Family/patient expects to be discharged to:: Private residence ?Living Arrangements: Spouse/significant other;Children ?Available Help at Discharge: Family ?Type of Home: Mobile home ?Home Access: Stairs to enter ?Entrance Stairs-Rails: Right;Left;Can  reach both ?Entrance Stairs-Number of Steps: 4 ?  ?Home Layout: One level ?Home Equipment: Cane - single point;Rollator (4 wheels);Rolling Walker (2 wheels) ?    ?  ?Prior Function Prior Level of Function : Independent/Modified Independent ?  ?  ?  ?  ?  ?  ?  ?  ?  ? ? ?Hand Dominance  ? Dominant Hand: Right ? ?  ?Extremity/Trunk Assessment  ? Upper Extremity Assessment ?Upper Extremity Assessment: Defer to OT evaluation ?  ? ?Lower Extremity Assessment ?Lower Extremity Assessment: LLE deficits/detail ?LLE: Unable to fully assess due to pain ?  ? ?Cervical / Trunk Assessment ?Cervical / Trunk Assessment: Normal  ?Communication  ? Communication: No difficulties  ?Cognition Arousal/Alertness: Awake/alert ?Behavior During Therapy: [facility] for tasks assessed/performed ?Overall Cognitive Status: Within Functional Limits for tasks assessed ?  ?  ?  ?  ?  ?  ?  ?  ?  ?  ?  ?  ?  ?  ?  ?  ?  ?  ?  ? ?  ?General Comments   ? ?  ?Exercises    ? ?Assessment/Plan  ?  ?PT Assessment Patient needs continued PT services  ?PT Problem List Decreased strength;Decreased range of motion;Decreased activity tolerance;Decreased balance;Decreased mobility;Decreased coordination;Decreased knowledge of use of DME;Decreased safety awareness;Pain ? ?   ?  ?PT Treatment Interventions DME instruction;Gait training;Stair training;Functional mobility training;Therapeutic activities;Therapeutic exercise;Balance training;Neuromuscular re-education;Patient/family education   ? ?PT Goals (Current goals can be found in the Care Plan section)  ?Acute Rehab PT Goals ?Patient Stated Goal: To walk normally ?PT Goal Formulation: With patient ?Time For Goal Achievement: 05/04/21 ?Potential to Achieve Goals: Good ? ?  ?Frequency 7X/week ?  ? ? ?Co-evaluation   ?  ?  ?  ?  ? ? ?  ?AM-PAC PT "6 Clicks" Mobility  ?Outcome Measure Help needed turning from your back to your side while in a flat bed without using bedrails?: None ?Help needed moving from lying on your back to sitting on the side of a flat bed without using bedrails?: A Little ?Help needed moving to and from a bed to a chair (including a wheelchair)?: A  Little ?Help needed standing up from a chair using your arms (e.g., wheelchair or bedside chair)?: A Little ?Help needed to walk in hospital room?: A Little ?Help needed climbing 3-5 steps with a railing? : A Lot ?6 Click Score: 18 ? ?  ?End of Session Equipment Utilized During Treatment: Gait belt ?Activity Tolerance: Patient tolerated treatment well ?Patient left: in chair;with call bell/phone within reach;with nursing/sitter in room ?Nurse Communication: Mobility status ?PT Visit Diagnosis: Difficulty in walking, not elsewhere classified (R26.2) ?  ? ?Time: 2094-7096 ?PT Time Calculation (min) (ACUTE ONLY): 23 min ? ? ?Charges:   PT Evaluation ?$PT Eval Low Complexity: 1 Low ?  ?  ?   ? ? ?Mauro Kaufmann PT ?Acute Rehabilitation Services ?Pager [phone] ?Office [phone] ? ? ?Rmoni Keplinger ?05/02/2021, 3:53 PM ? ?

## 2021-05-02 NOTE — Progress Notes (Signed)
[facility] ?PROGRESS NOTE ? ? ? ?Progress Note  ?Craig Bauer  DZH:[id number] DOB: 1950/06/08 DOA: 05/01/2021 ?PCP: Courtney Heys, PA-C  ? ? ? ?Brief Narrative:  ? ?Craig Bauer is an 71 y.o. 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 ?Assessment/Plan:  ? ?Sepsis probably due to left nonpurulent lower extremity cellulitis: ?Orthopedic surgery has been consulted related no need for aspiration or I&D at this point in time. ?Tmax of 99 she was started empirically on IV vancomycin and Rocephin. ?Blood cultures are negative till date.  Leukocytosis is resolved. ? ?Acute metabolic encephalopathy: ?Likely polypharmacy imaging showed no acute findings. ?Now resolved. ?We can start back some of his psychotropic home meds including narcotics for pain control of the left lower knee. ? ?Hypervolemic hyponatremia: ?He was fluid restricted continue on his oral Lasix his sodium this morning is 134. ?Continue fluid restriction and oral Lasix. ?2D echo is pending this morning. ?Strict I's and O's ? ?Hyperglycemia/diabetes mellitus type 2 uncontrolled: ?With an A1c of 9.4, oral hypoglycemic agents were held. ?Was started on long-acting insulin plus sliding scale his blood glucose this morning is 180. ?Can resume Jardiance orally. ? ? ?Right arm weakness: ?MRI of the cervical spine shows moderate foraminal stenosis will need further management as an outpatient. ? ?Bilateral lower extremity edema: ?Lungs are clear, has no JVD on physical exam 2D echo is pending. ? ?   ? ?DVT prophylaxis: xarelto ?Family Communication:none ?Status is: Inpatient ?Remains inpatient appropriate because: Severe sepsis with cellulitis. ? ? ? ?Code Status:  ? ?  ?Code Status Orders  ?(From  admission, onward)  ?  ? ? ?  ? ?  Start     Ordered  ? 05/01/21 1950  Full code  Continuous       ? 05/01/21 1949  ? ?  ?  ? ?  ? ?Code Status History   ? ? Date Active Date Inactive Code Status Order ID Comments User Context  ? 09/29/2020 1658 09/30/2020 1957 Full Code [id number]  [name], PA-C Inpatient  ? ?  ? ? ? ? ?IV Access:  ? ?Peripheral IV ? ? ?Procedures and diagnostic studies:  ? ?CT Head Wo Contrast ? ?Result Date: 05/01/2021 ?CLINICAL DATA:  Transient ischemic attack.  Right arm numbness. EXAM: CT HEAD WITHOUT CONTRAST TECHNIQUE: Contiguous axial images were obtained from the base of the skull through the vertex without intravenous contrast. RADIATION DOSE REDUCTION: This exam was performed according to the departmental dose-optimization program which includes automated exposure control, adjustment of the mA and/or kV according to patient size and/or use of iterative reconstruction technique. COMPARISON:  CT head dated May 29, 2007 FINDINGS: Brain: No evidence of acute infarction, hemorrhage, hydrocephalus, extra-axial collection or mass lesion/mass effect. Vascular: No hyperdense vessel or unexpected calcification. Skull: Normal. Negative for fracture or focal lesion. Sinuses/Orbits: No acute finding. Other: None. IMPRESSION: No acute intracranial abnormality. MRI examination could be considered for further evaluation if clinically warranted. Electronically Signed   By: Larose Hires D.O.   On: 05/01/2021 13:20  ? ?MR BRAIN WO CONTRAST ? ?Result Date: 05/01/2021 ?CLINICAL DATA:  Provided history: Neuro deficit, acute, stroke suspected. Additional history provided: Patient developed difficulty with right arm with numbness and weakness. Decreased grip strength. EXAM: MRI  HEAD WITHOUT CONTRAST TECHNIQUE: Multiplanar, multiecho pulse sequences of the brain and surrounding structures were obtained without intravenous contrast. COMPARISON:  Prior head CT examinations 05/01/2021 and earlier. FINDINGS:  Intermittently motion degraded examination, limiting evaluation. Most notably, there is moderate motion degradation of the sagittal T1 weighted sequence and moderate motion a shin of the coronal T2 TSE sequence. Brain: No age advanced or lobar predominant parenchymal atrophy. Moderate multifocal T2 FLAIR hyperintense signal abnormality within the cerebral white matter, nonspecific but compatible with chronic small vessel ischemic disease. Mild chronic small vessel ischemic changes are also present within the pons. Chronic There is no acute infarct. No evidence of an intracranial mass. No chronic intracranial blood products. No extra-axial fluid collection. No midline shift. Vascular: Maintained flow voids within the proximal large arterial vessels. Skull and upper cervical spine: No focal suspicious marrow lesion. Sinuses/Orbits: Visualized orbits show no acute finding. Prior left ocular lens replacement. Small mucous retention cyst within the left maxillary sinus. Other: Small-volume fluid within the bilateral mastoid air cells. IMPRESSION: Intermittently motion degraded examination. No evidence of acute intracranial abnormality. Chronic small vessel ischemic changes which are moderate in the cerebral white matter, and mild in the pons. Small mucous retention cyst within the left maxillary sinus. Small-volume fluid within the bilateral mastoid air cells. Electronically Signed   By: Jackey LogeKyle  Golden D.O.   On: 05/01/2021 16:15  ? ?MR Cervical Spine Wo Contrast ? ?Result Date: 05/01/2021 ?CLINICAL DATA:  Myelopathy, acute, cervical spine. Right arm numbness and weakness EXAM: MRI CERVICAL SPINE WITHOUT CONTRAST TECHNIQUE: Multiplanar, multisequence MR imaging of the cervical spine was performed. No intravenous contrast was administered. COMPARISON:  None. FINDINGS: Alignment: Physiologic. Vertebrae: No fracture, evidence of discitis, or bone lesion. Cord: Normal signal and morphology. Posterior Fossa, vertebral arteries,  paraspinal tissues: Negative. Disc levels: C2-C3: No disc protrusion. Left-sided facet arthropathy and minimal uncovertebral spurring. No foraminal or canal stenosis. C3-C4: Minimal disc bulge. Mild bilateral facet and uncovertebral arthropathy. Mild left foraminal stenosis. Borderline-mild canal stenosis. C4-C5: Mild disc osteophyte complex with mild facet and uncovertebral arthropathy. Findings result in moderate left and mild right foraminal stenosis with borderline-mild canal stenosis. C5-C6: Disc osteophyte complex with bilateral facet and uncovertebral arthropathy. Findings result in moderate-severe right and moderate left foraminal stenosis with moderate canal stenosis. C6-C7: Mild disc bulge and mild facet and uncovertebral arthropathy. No significant foraminal or canal stenosis. C7-T1: No significant disc protrusion, foraminal stenosis, or canal stenosis. IMPRESSION: Multilevel cervical spondylosis most pronounced at the C5-6 level where there is moderate-severe right and moderate left foraminal stenosis as well as moderate canal stenosis. Electronically Signed   By: Duanne GuessNicholas  Plundo D.O.   On: 05/01/2021 16:29  ? ?DG Chest Portable 1 View ? ?Result Date: 05/01/2021 ?CLINICAL DATA:  COPD.  Arm numbness. EXAM: PORTABLE CHEST 1 VIEW COMPARISON:  April 27, 2021 FINDINGS: Cardiomediastinal silhouette is normal. Mediastinal contours appear intact. Likely exaggerated contour of the ascending aorta, likely projectional, as it looked normal on April 27, 2021. There is no evidence of focal airspace consolidation, pleural effusion or pneumothorax. Osseous structures are without acute abnormality. Soft tissues are grossly normal. IMPRESSION: 1. No active disease. 2. Likely exaggerated contour of the ascending aorta, likely projectional. Recommend follow-up with PA and lateral radiograph of the chest to exclude aortic pathology. Electronically Signed   By: Ted Mcalpineobrinka  Dimitrova M.D.   On: 05/01/2021 13:55  ? ?DG Knee  Complete 4 Views Left ? ?Result Date: 05/01/2021 ?CLINICAL DATA:  Left knee redness and warmth  after arthroplasty on 04/27/2021 EXAM: LEFT KNEE - COMPLETE 4+ VIEW COMPARISON:  None. FINDINGS: Postsurgical changes from lef

## 2021-05-02 NOTE — Progress Notes (Signed)
Echocardiogram ?2D Echocardiogram has been performed. ? ?Arlyss Gandy ?05/02/2021, 12:04 PM ?

## 2021-05-03 ENCOUNTER — Inpatient Hospital Stay (HOSPITAL_COMMUNITY): Payer: Medicare HMO

## 2021-05-03 DIAGNOSIS — L03116 Cellulitis of left lower limb: Secondary | ICD-10-CM | POA: Diagnosis not present

## 2021-05-03 DIAGNOSIS — R9389 Abnormal findings on diagnostic imaging of other specified body structures: Secondary | ICD-10-CM

## 2021-05-03 LAB — LIPID PANEL
Cholesterol: 142 mg/dL (ref 0–200)
HDL: 45 mg/dL (ref >40)
LDL Cholesterol: 74 mg/dL (ref 0–99)
Total CHOL/HDL Ratio: 3.2 RATIO
Triglycerides: 116 mg/dL (ref <150)
VLDL: 23 mg/dL (ref 0–40)

## 2021-05-03 LAB — GLUCOSE, CAPILLARY
Glucose-Capillary: 173 mg/dL — ABNORMAL HIGH (ref 70–99)
Glucose-Capillary: 175 mg/dL — ABNORMAL HIGH (ref 70–99)
Glucose-Capillary: 274 mg/dL — ABNORMAL HIGH (ref 70–99)

## 2021-05-03 LAB — HEMOGLOBIN A1C
Hgb A1c MFr Bld: 8.8 % — ABNORMAL HIGH (ref 4.8–5.6)
Mean Plasma Glucose: 205.86 mg/dL

## 2021-05-03 MED ORDER — CEFADROXIL 500 MG PO CAPS: 500.0000 mg | ORAL_CAPSULE | Freq: Two times a day (BID) | ORAL | 0 refills | Status: AC

## 2021-05-03 MED ORDER — ATORVASTATIN CALCIUM 40 MG PO TABS: 80.0000 mg | ORAL_TABLET | Freq: Every day | ORAL | 0 refills | Status: AC

## 2021-05-03 MED ORDER — ALPRAZOLAM 0.5 MG PO TABS
0.5000 mg | ORAL_TABLET | Freq: Three times a day (TID) | ORAL | Status: DC | PRN
  Administered 2021-05-03: 0.5 mg via ORAL
  Filled 2021-05-03: qty 1

## 2021-05-03 NOTE — Assessment & Plan Note (Addendum)
Discharge with home diabetes regimen ?Not at goal, needs further titration outpatient ?

## 2021-05-03 NOTE — Progress Notes (Signed)
Subjective:  ?  Patient was admitted over the weekend for possible cellulitis and also RUE weakness. ?He states that he feels great and has no significant pain in his leg. He feels it is doing much better. ? ?Activity level:  wbat ?Diet tolerance:  ok ?Voiding:  ok ?Patient reports pain as mild.   ? ?Objective: ?Vital signs in last 24 hours: ?Temp:  [98.2 ?F (36.8 ?C)-98.9 ?F (37.2 ?C)] 98.2 ?F (36.8 ?C) (04/24 0532) ?Pulse Rate:  [82-87] 87 (04/24 0532) ?Resp:  [16-18] 16 (04/24 0532) ?BP: (128-144)/(63-69) 136/63 (04/24 0532) ?SpO2:  [95 %-97 %] 97 % (04/24 0532) ?Weight:  [99.4 kg] 99.4 kg (04/24 0532) ? ?Labs: ?Recent Labs  ?  05/01/21 ?1253 05/01/21 ?1302 05/02/21 ?0429  ?HGB 14.3 15.6 13.5  ? ?Recent Labs  ?  05/01/21 ?1253 05/01/21 ?1302 05/02/21 ?0429  ?WBC 11.8*  --  9.1  ?RBC 4.84  --  4.63  ?HCT 43.3 46.0 42.2  ?PLT 288  --  214  ? ?Recent Labs  ?  05/01/21 ?1253 05/01/21 ?1302 05/02/21 ?0429  ?NA 128* 129* 134*  ?K 4.7 5.0 4.5  ?CL 96* 94* 102  ?CO2 24  --  25  ?BUN 17 18 13   ?CREATININE 0.94 0.80 0.76  ?GLUCOSE 400* 418* 180*  ?CALCIUM 8.7*  --  8.5*  ? ?Recent Labs  ?  05/01/21 ?1253  ?INR 1.0  ? ? ?Physical Exam: ? Neurologically intact ?ABD soft ?Neurovascular intact ?Sensation intact distally ?Intact pulses distally ?Dorsiflexion/Plantar flexion intact ?Incision: dressing C/D/I and no drainage ?No cellulitis present ?Compartment soft ?Knee ROM from 0-95 degrees. ? ?Assessment/Plan: ?Patient is moving knee great. He has some bruising in his leg but no significant redness. He is laying comfortably in bed with his legs crossed over one another. He had bruising and a little erythema like this after other side was replaced. He has oral keflex at home that I believe he can finish up. His WBC is 9.1 and he has no fever. We will follow up with him in the office in the next few days as already scheduled. We greatly appreciate medical management. From an ortho standpoint if he passes PT I think he is clear to  go home. We will leave medical management up to the primary team.  ?   ? ? ? ?05/03/21 Taylon Louison ?05/03/2021, 8:08 AM ? ?

## 2021-05-03 NOTE — Plan of Care (Signed)
  Problem: Clinical Measurements: Goal: Postoperative complications will be avoided or minimized Outcome: Adequate for Discharge   Problem: Skin Integrity: Goal: Demonstration of wound healing without infection will improve Outcome: Adequate for Discharge   Problem: Education: Goal: Knowledge of General Education information will improve Description: Including pain rating scale, medication(s)/side effects and non-pharmacologic comfort measures Outcome: Adequate for Discharge   Problem: Health Behavior/Discharge Planning: Goal: Ability to manage health-related needs will improve Outcome: Adequate for Discharge   Problem: Clinical Measurements: Goal: Ability to maintain clinical measurements within normal limits will improve Outcome: Adequate for Discharge Goal: Will remain free from infection Outcome: Adequate for Discharge Goal: Diagnostic test results will improve Outcome: Adequate for Discharge Goal: Respiratory complications will improve Outcome: Adequate for Discharge Goal: Cardiovascular complication will be avoided Outcome: Adequate for Discharge   Problem: Activity: Goal: Risk for activity intolerance will decrease Outcome: Adequate for Discharge   Problem: Nutrition: Goal: Adequate nutrition will be maintained Outcome: Adequate for Discharge   Problem: Coping: Goal: Level of anxiety will decrease Outcome: Adequate for Discharge   Problem: Elimination: Goal: Will not experience complications related to bowel motility Outcome: Adequate for Discharge Goal: Will not experience complications related to urinary retention Outcome: Adequate for Discharge   Problem: Pain Managment: Goal: General experience of comfort will improve Outcome: Adequate for Discharge   Problem: Safety: Goal: Ability to remain free from injury will improve Outcome: Adequate for Discharge   Problem: Skin Integrity: Goal: Risk for impaired skin integrity will decrease Outcome: Adequate  for Discharge   

## 2021-05-03 NOTE — Assessment & Plan Note (Signed)
Left lower extremity cellulitis improved today ?Will discharge with duricef ?Follow up with orthopedics as scheduled ?

## 2021-05-03 NOTE — Assessment & Plan Note (Addendum)
Repeat CXR ?2 view with tortuosity of ascending aorta, follow outpatient ?

## 2021-05-03 NOTE — Assessment & Plan Note (Addendum)
Resolved at this time, concerning for TIA ?MRI brain motion degraded, without acute abnormality ?C spine with multilevel spondylosis most pronounced at C5-6 level, follow outpatient ?Carotid US with 1-39% stenosis bilaterally  ?Echo with EF 60-65%, can't exclude small PFO ?LDL 74, increase lipitor to 80 ?A1c above goal, needs further insulin management with PCP ?Continue xarelto  ?Will refer to neurology to follow outpatient ?

## 2021-05-03 NOTE — Hospital Course (Signed)
Craig Bauer is an 71 y.o. 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  His hospitalization was also complicated by right arm weakness, which has resolved.   ? ?He's improved at this time, plan for discharge on antibiotics and with neurology follow up. ? ?See below for additional details ?

## 2021-05-03 NOTE — Assessment & Plan Note (Signed)
Appears euvolemic today.

## 2021-05-03 NOTE — Assessment & Plan Note (Signed)
mild

## 2021-05-03 NOTE — Progress Notes (Signed)
Transition of Care [facility]) - Emergency Department Mini Assessment ? ? ?Patient Details  ?Name: Craig Bauer ?MRN: [id number] ?Date of Birth: 06/21/1950 ? ?Transition of Care (TOC) CM/SW Contact:    ?Roseanne Kaufman, RN ?Phone Number: ?05/03/2021, 5:37 PM ? ? ?Clinical Narrative: ?RNCM received TOC consult for HHC: PT. ? ? ?ED Mini Assessment: ? RNCM  received request from MD [place]: PT needs. This RNCM spoke with patient's wife [name] who advised prior to admission patient was receiving HHC from [facility] [place]. Patient's wife [name] states she has spoke with [facility] and they will resume his [facility] services post discharge.  ?No additional TOC needs at this time.  ? ?Barriers to Discharge: Continued Medical Work up ? ? ?Patient Contact and Communications ?  ? Patient:  [phone] ?  ?Admission diagnosis:  Knee swelling [M25.469] ?Hyperglycemia [R73.9] ?Lower extremity cellulitis [id number] ?Cellulitis of right lower leg [L03.115] ?Weakness of extremity [R29.898] ?Patient Active Problem List  ? Diagnosis Date Noted  ? Abnormal chest x-ray 05/03/2021  ? Cellulitis of left leg 05/01/2021  ? Acute metabolic encephalopathy [id number]  ? Sepsis ([place]) 05/01/2021  ? Right arm weakness 05/01/2021  ? Cellulitis of right lower leg 05/01/2021  ? Hyponatremia 05/01/2021  ? Uncontrolled type 2 diabetes mellitus with hyperglycemia, with long-term current use of insulin ([place]) 05/01/2021  ? Bilateral lower extremity edema 05/01/2021  ? Primary osteoarthritis of left knee 04/27/2021  ? Primary localized osteoarthritis of right knee 09/29/2020  ? Primary osteoarthritis of right knee 09/29/2020  ? ?PCP:  Reyes Ivan, PA-C ?Pharmacy:   ?[place], [place] - [id number] - [place], [address] ?[place] [address] ?Phone: [phone] Fax: [phone] ?  ?

## 2021-05-03 NOTE — Progress Notes (Signed)
Physical Therapy Treatment ?Patient Details ?Name: Sonam Huelsmann ?MRN: [id number] ?DOB: 23-Dec-1950 ?Today's Date: 05/03/2021 ? ? ?History of Present Illness Pt admitted from home with L knee pain and dx with severe sepsis 2* L LE cellulitis; acute metabolic encephalopathy, hypovolemic hyponatremia, and hyperglycemia.  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 ?  ?PT Comments  ? ? POD # 7 L TKR Re Admit for Cellulitis ?General Comments: AxO x 3 "in a bad mood", eager to go home. Pt self able to get OOB.  General transfer comment: impulsive, VC's on safety.  General Gait Details: 25% VC's on safety with turns and proper walker to self distance.  VC's to decrease gait speed.  Impulsive.  Honary. Practiced stairs.  50% VC's on proper sequencing and safety.  Then returned to room to perform some TE's following HEP handout.  Instructed on proper tech, freq as well as use of ICE.   Session inturrupted by [employer].  ?Pt plans to D/C back home with Spouse. ?  ?  ?Recommendations for follow up therapy are one component of a multi-disciplinary discharge planning process, led by the attending physician.  Recommendations may be updated based on patient status, additional functional criteria and insurance authorization. ? ?Follow Up Recommendations ? Home health PT ?  ?  ?Assistance Recommended at Discharge Intermittent Supervision/Assistance  ?Patient can return home with the following A little help with walking and/or transfers;A little help with bathing/dressing/bathroom;Assistance with cooking/housework;Assist for transportation;Help with stairs or ramp for entrance ?  ?Equipment Recommendations ? None recommended by PT  ?  ?Recommendations for Other Services   ? ? ?  ?Precautions / Restrictions Precautions ?Precautions: Knee;Fall ?Precaution Comments: instructed no pillow under knee ?Restrictions ?Weight Bearing Restrictions: No ?Other Position/Activity Restrictions: WBAT  ?  ? ?Mobility ? Bed  Mobility ?Overal bed mobility: Modified Independent ?  ?  ?  ?  ?  ?  ?General bed mobility comments: self able with increased time ?  ? ?Transfers ?Overall transfer level: Needs assistance ?Equipment used: Rolling walker (2 wheels) ?Transfers: Sit to/from Stand ?Sit to Stand: Supervision ?  ?  ?  ?  ?  ?General transfer comment: impulsive, VC's on safety ?  ? ?Ambulation/Gait ?Ambulation/Gait assistance: Supervision ?Gait Distance (Feet): 115 Feet ?Assistive device: Rolling walker (2 wheels) ?Gait Pattern/deviations: Step-to pattern, Shuffle, Antalgic, Decreased stance time - left ?  ?  ?  ?General Gait Details: 25% VC's on safety with turns and proper walker to self distance.  VC's to decrease gait speed.  Impulsive.  Honary. ? ? ?Stairs ?  ?  ?  ?  ?  ? ? ?Wheelchair Mobility ?  ? ?Modified Rankin (Stroke Patients Only) ?  ? ? ?  ?Balance   ?  ?  ?  ?  ?  ?  ?  ?  ?  ?  ?  ?  ?  ?  ?  ?  ?  ?  ?  ? ?  ?Cognition Arousal/Alertness: Awake/alert ?Behavior During Therapy: [facility] for tasks assessed/performed ?Overall Cognitive Status: Within Functional Limits for tasks assessed ?  ?  ?  ?  ?  ?  ?  ?  ?  ?  ?  ?  ?  ?  ?  ?  ?General Comments: AxO x 3 "in a bad mood", eager to go home. ?  ?  ? ?  ?Exercises  Total Knee Replacement TE's following HEP  handout ?10 reps B LE ankle pumps ?05 reps towel squeezes ?05 reps knee presses ?05 reps heel slides  ? ?Educated on use of gait belt to assist with TE's ?Followed by ICE ? ? ?  ?General Comments   ?  ?  ? ?Pertinent Vitals/Pain Pain Assessment ?Pain Assessment: Faces ?Faces Pain Scale: Hurts even more ?Pain Location: L knee with mobility and TE's ?Pain Descriptors / Indicators: Aching, Grimacing, Throbbing, Operative site guarding ?Pain Intervention(s): Monitored during session, Premedicated before session, Repositioned, Ice applied  ? ? ?Home Living   ?  ?  ?  ?  ?  ?  ?  ?  ?  ?   ?  ?Prior Function    ?  ?  ?   ? ?PT Goals (current goals can now be found in the care plan  section) Progress towards PT goals: Progressing toward goals ? ?  ?Frequency ? ? ? 7X/week ? ? ? ?  ?PT Plan Current plan remains appropriate  ? ? ?Co-evaluation   ?  ?  ?  ?  ? ?  ?AM-PAC PT "6 Clicks" Mobility   ?Outcome Measure ? Help needed turning from your back to your side while in a flat bed without using bedrails?: None ?Help needed moving from lying on your back to sitting on the side of a flat bed without using bedrails?: None ?Help needed moving to and from a bed to a chair (including a wheelchair)?: A Little ?Help needed standing up from a chair using your arms (e.g., wheelchair or bedside chair)?: A Little ?Help needed to walk in hospital room?: A Little ?Help needed climbing 3-5 steps with a railing? : A Little ?6 Click Score: 20 ? ?  ?End of Session Equipment Utilized During Treatment: Gait belt ?Activity Tolerance: Patient tolerated treatment well ?Patient left: with call bell/phone within reach;with family/visitor present;in bed ?Nurse Communication: Mobility status ?PT Visit Diagnosis: Difficulty in walking, not elsewhere classified (R26.2) ?  ? ? ?Time: 9798-9211 ?PT Time Calculation (min) (ACUTE ONLY): 32 min ? ?Charges:  $Gait Training: 8-22 mins ?$Therapeutic Exercise: 8-22 mins          ?          ? ?Felecia Shelling  PTA ?Acute  Rehabilitation Services ?Pager      [phone] ?Office      [phone] ? ? ? ?

## 2021-05-03 NOTE — Assessment & Plan Note (Signed)
Resolved, this was likely related to his chronic pain meds ?Will need to use these cautiously, he's on high doses which may need to be adjusted if he has recurrent encephalopathy in the future ?

## 2021-05-03 NOTE — Progress Notes (Signed)
Carotid artery duplex has been completed. ?Preliminary results can be found in CV Proc through chart review.  ? ?05/03/21 12:00 PM ?Carlos Levering RVT   ?

## 2021-05-03 NOTE — Discharge Summary (Signed)
Physician Discharge Summary  ?Craig Bauer [id number] DOB: 01-22-1950 DOA: 05/01/2021 ? ?PCP: Reyes Ivan, PA-C ? ?Admit date: 05/01/2021 ?Discharge date: 05/03/2021 ? ?Time spent: 40 minutes ? ?Recommendations for Outpatient Follow-up:  ?Follow outpatient CBC/CMP  ?Follow with orthopedics as scheduled ?Follow with neurology for concern for TIA ?Follow possible patent foramen ovale outpatient ?Follow diabetes regimen outpatient, adjust as needed ?Tortuous aorta, follow outpatient, consider additional imaging ? ?Discharge Diagnoses:  ?Principal Problem: ?  Cellulitis of left leg ?Active Problems: ?  Sepsis ([place]) ?  Acute metabolic encephalopathy ?  Right arm weakness ?  Hyponatremia ?  Uncontrolled type 2 diabetes mellitus with hyperglycemia, with long-term current use of insulin ([place]) ?  Bilateral lower extremity edema ?  Abnormal chest x-ray ?  Cellulitis of right lower leg ? ? ?Discharge Condition: stable ? ?Diet recommendation: heart healthy, diabetic ? ?Filed Weights  ? 05/03/21 0532  ?Weight: 99.4 kg  ? ? ?History of present illness:  ?Craig Bauer is an 71 y.o. male past medical history of essential hypertension, diabetes mellitus type 2 with Mallorie Norrod last hemoglobin A1c of 9.4, COPD not oxygen dependent recently discharged from the hospital for Constanza Mincy total knee replacement on April 27, 2021 comes into the hospital for cellulitis of the left lower extremity.  His hospitalization was also complicated by right arm weakness, which has resolved.   ? ?He's improved at this time, plan for discharge on antibiotics and with neurology follow up. ? ?See below for additional details ? ?Hospital Course:  ?Assessment and Plan: ?* Cellulitis of left leg ?Left lower extremity cellulitis improved today ?Will discharge with duricef ?Follow up with orthopedics as scheduled ? ?Right arm weakness ?Resolved at this time, concerning for TIA ?MRI brain motion degraded, without acute abnormality ?C spine with multilevel spondylosis  most pronounced at C5-6 level, follow outpatient ?Carotid US with 1-39% stenosis bilaterally  ?Echo with EF 60-65%, can't exclude small PFO ?LDL 74, increase lipitor to 80 ?A1c above goal, needs further insulin management with PCP ?Continue xarelto  ?Will refer to neurology to follow outpatient ? ?Acute metabolic encephalopathy ?Resolved, this was likely related to his chronic pain meds ?Will need to use these cautiously, he's on high doses which may need to be adjusted if he has recurrent encephalopathy in the future ? ?Hyponatremia ?mild ? ?Uncontrolled type 2 diabetes mellitus with hyperglycemia, with long-term current use of insulin ([place]) ?Discharge with home diabetes regimen ?Not at goal, needs further titration outpatient ? ?Bilateral lower extremity edema ?Appears euvolemic today ? ?Abnormal chest x-ray ?Repeat CXR ?2 view with tortuosity of ascending aorta, follow outpatient ? ? ?Procedures: ? Echo ?IMPRESSIONS  ? ? ? 1. Left ventricular ejection fraction, by estimation, is 60 to 65%. Left  ?ventricular ejection fraction by PLAX is 64 %. The left ventricle has  ?normal function. The left ventricle has no regional wall motion  ?abnormalities. There is moderate asymmetric  ?left ventricular hypertrophy of the basal-septal segment. Left ventricular  ?diastolic parameters are consistent with Grade I diastolic dysfunction  ?(impaired relaxation).  ? 2. Right ventricular systolic function is hyperdynamic. The right  ?ventricular size is normal. There is normal pulmonary artery systolic  ?pressure. The estimated right ventricular systolic pressure is [id number] mmHg.  ? 3. Left atrial size was moderately dilated.  ? 4. The mitral valve is abnormal. Trivial mitral valve regurgitation.  ? 5. The aortic valve is tricuspid. Aortic valve regurgitation is not  ?visualized.  ? 6. Aortic dilatation noted. There is borderline dilatation  of the  ?ascending aorta, measuring 39 mm.  ? 7. The inferior vena cava is dilated in size  with <50% respiratory  ?variability, suggesting right atrial pressure of 15 mmHg.  ? 8. Cannot exclude Xai Frerking small PFO.  ? ?Comparison(s): No prior Echocardiogram.  ? ?Carotid US ?Summary:  ?Right Carotid: Velocities in the right ICA are consistent with Kholton Coate 1-39%  ?stenosis.  ? ?Left Carotid: Velocities in the left ICA are consistent with Deberah Adolf 1-39%  ?stenosis.  ? ?Vertebrals: Bilateral vertebral arteries demonstrate antegrade flow.  ? ?Consultations: ?orthopedics ? ?Discharge Exam: ?Vitals:  ? 05/03/21 0532 05/03/21 1207  ?BP: 136/63 (!) 151/69  ?Pulse: 87 86  ?Resp: 16 18  ?Temp: 98.2 ?F (36.8 ?C) 98.6 ?F (37 ?C)  ?SpO2: 97% 98%  ? ?Eager for discharge ?Discussed with wife ? ?General: No acute distress. ?Cardiovascular: RRR ?Lungs: unlabored ?Abdomen: Soft, nontender, nondistended  ?Neurological: Alert and oriented ?3. Moves all extremities ?4 - symmetric upper extremity strength. Cranial nerves II through XII grossly intact. ?Extremities: L knee with intact dressing, minimal swelling, good ROM ? ?Discharge Instructions ? ? ?Discharge Instructions   ? ? Ambulatory referral to Neurology   Complete by: As directed ?  ? An appointment is requested in approximately: 2 weeks  ? Call MD for:  difficulty breathing, headache or visual disturbances   Complete by: As directed ?  ? Call MD for:  difficulty breathing, headache or visual disturbances   Complete by: As directed ?  ? Call MD for:  extreme fatigue   Complete by: As directed ?  ? Call MD for:  extreme fatigue   Complete by: As directed ?  ? Call MD for:  hives   Complete by: As directed ?  ? Call MD for:  hives   Complete by: As directed ?  ? Call MD for:  persistant dizziness or light-headedness   Complete by: As directed ?  ? Call MD for:  persistant dizziness or light-headedness   Complete by: As directed ?  ? Call MD for:  persistant nausea and vomiting   Complete by: As directed ?  ? Call MD for:  persistant nausea and vomiting   Complete by: As directed ?  ? Call MD  for:  redness, tenderness, or signs of infection (pain, swelling, redness, odor or green/yellow discharge around incision site)   Complete by: As directed ?  ? Call MD for:  redness, tenderness, or signs of infection (pain, swelling, redness, odor or green/yellow discharge around incision site)   Complete by: As directed ?  ? Call MD for:  severe uncontrolled pain   Complete by: As directed ?  ? Call MD for:  severe uncontrolled pain   Complete by: As directed ?  ? Call MD for:  temperature >100.4   Complete by: As directed ?  ? Call MD for:  temperature >100.4   Complete by: As directed ?  ? Diet - low sodium heart healthy   Complete by: As directed ?  ? Diet - low sodium heart healthy   Complete by: As directed ?  ? Discharge instructions   Complete by: As directed ?  ? You were seen for transient right upper extremity weakness and sensory changes which have all resolved. ? ?This may have been Latrise Bowland TIA (ministroke).  It also maybe related to changes in your cervical spine.  I've referred you to neurology as an outpatient.  Continue your xarelto and atorvastatin for now.  Follow your  A1c (your a1c was 9.4 on 04/14/2020, you'll need to continue to adjust your diabetes regimen for better control).  Your LDL (bad cholesterol) was 74 and your goal is less than 70, we'll increase your lipitor to 80 mg daily.  Follow up with neurology outpatient for additional workup and recommendations.  Your echo showed Isaiah Torok possible patent foramen ovale, this needs further follow up outpatient.  ? ?Your confusion was related to your pain meds.  Use these with caution.  You may need Roslin Norwood reduction in dose if this is recurrent. ? ?We'll send you home with duricef for your cellulitis.  This looks better today.  Watch for fevers, redness, swelling, etc.  Call your doctor if you notice this. ? ?Return for new, recurrent, or worsening symptoms. ? ?Please ask your PCP to request records from this hospitalization so they know what was done and what the  next steps will be.  ? Discharge wound care:   Complete by: As directed ?  ? Per orthopedics  ? Discharge wound care:   Complete by: As directed ?  ? Per orthopedics  ? Increase activity slowly   Complete

## 2021-05-06 LAB — CULTURE, BLOOD (ROUTINE X 2)
Culture: NO GROWTH
Culture: NO GROWTH
Special Requests: ADEQUATE
Special Requests: ADEQUATE

## 2023-09-12 IMAGING — DX DG CHEST 2V
2 series · 2 of 2 positions shown · non-contrast
Comparison: Chest radiograph 05/01/2021, 04/27/2021, additional
priors reviewed

CLINICAL DATA: Follow-up abnormal x-ray.

EXAM:
CHEST - 2 VIEW

[chest pa]
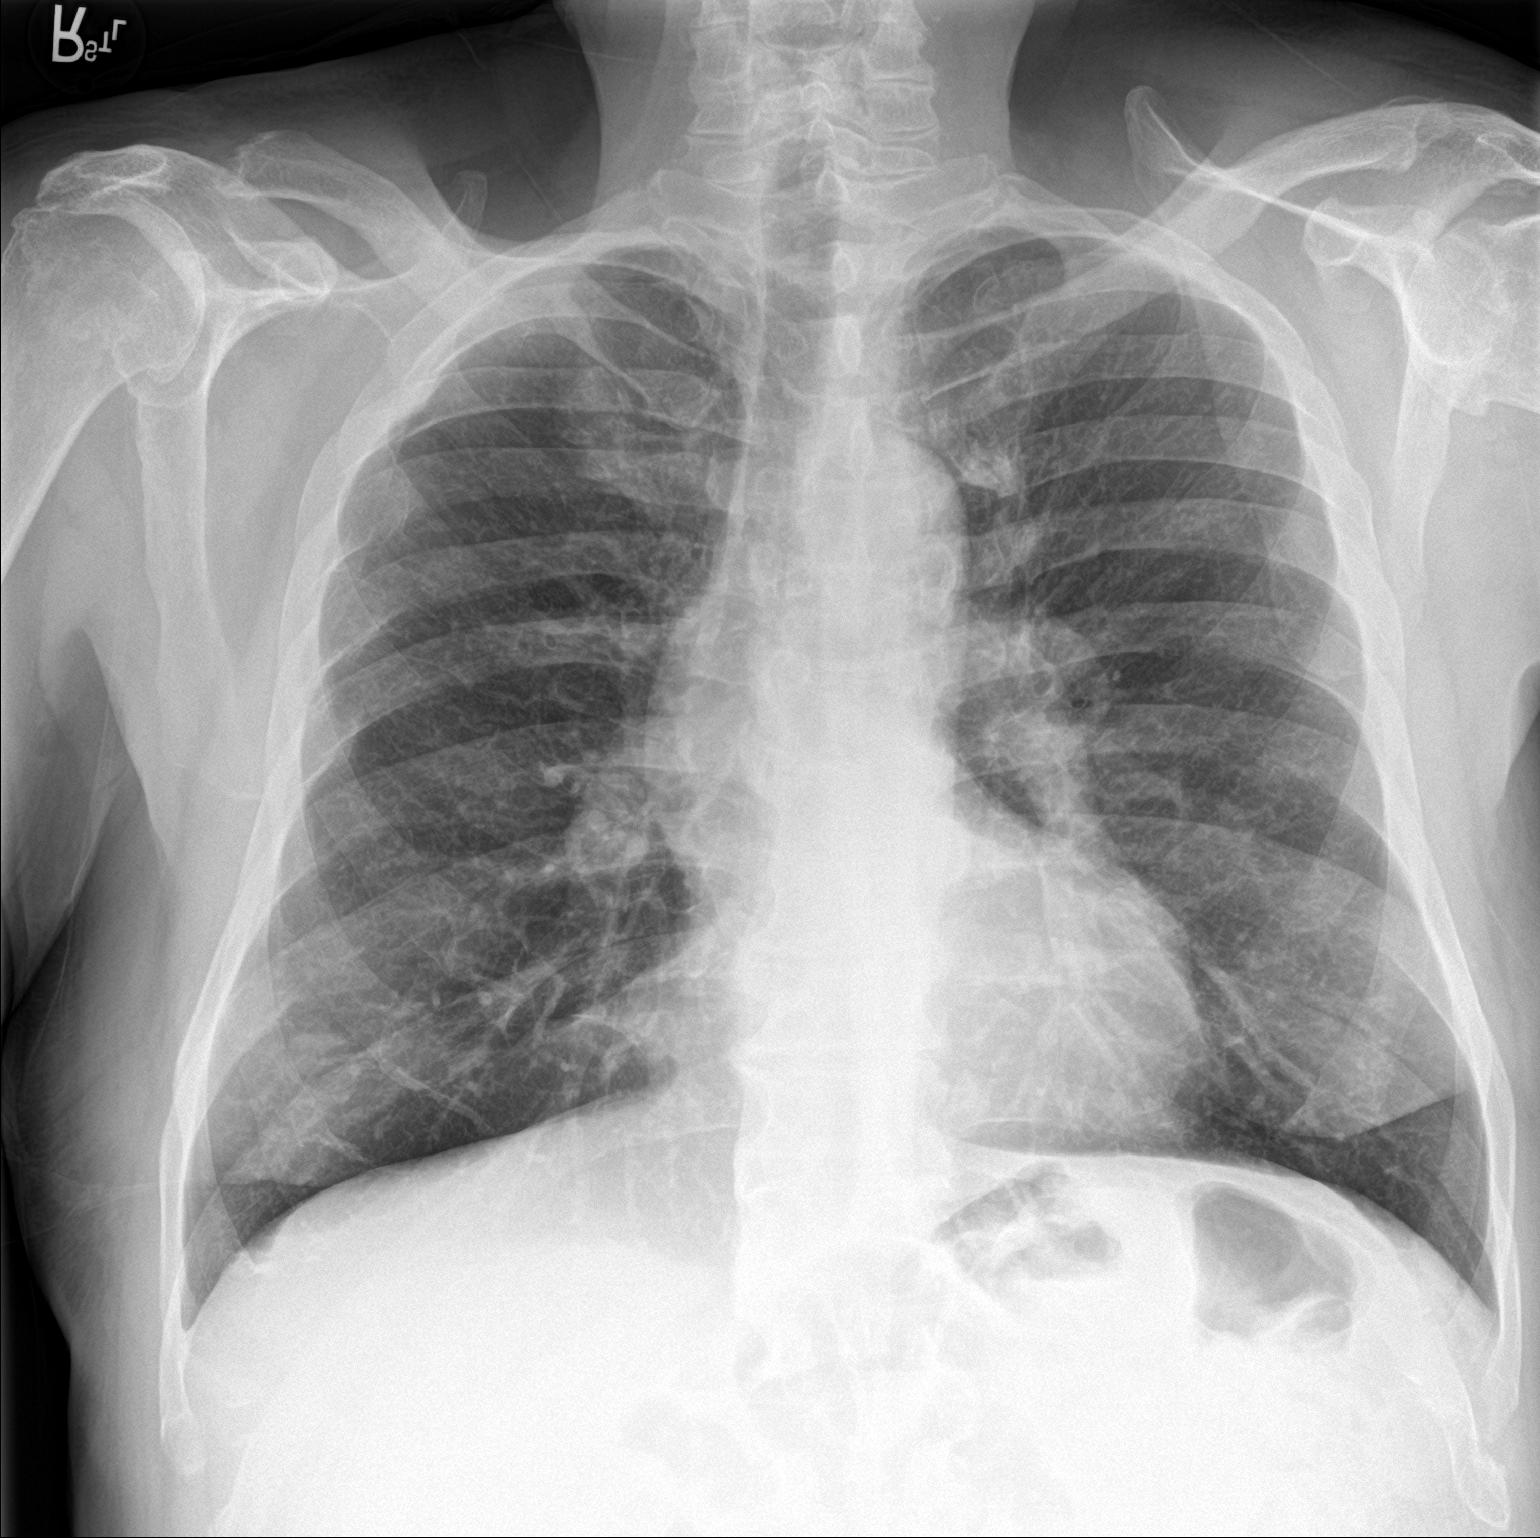

[chest lat]
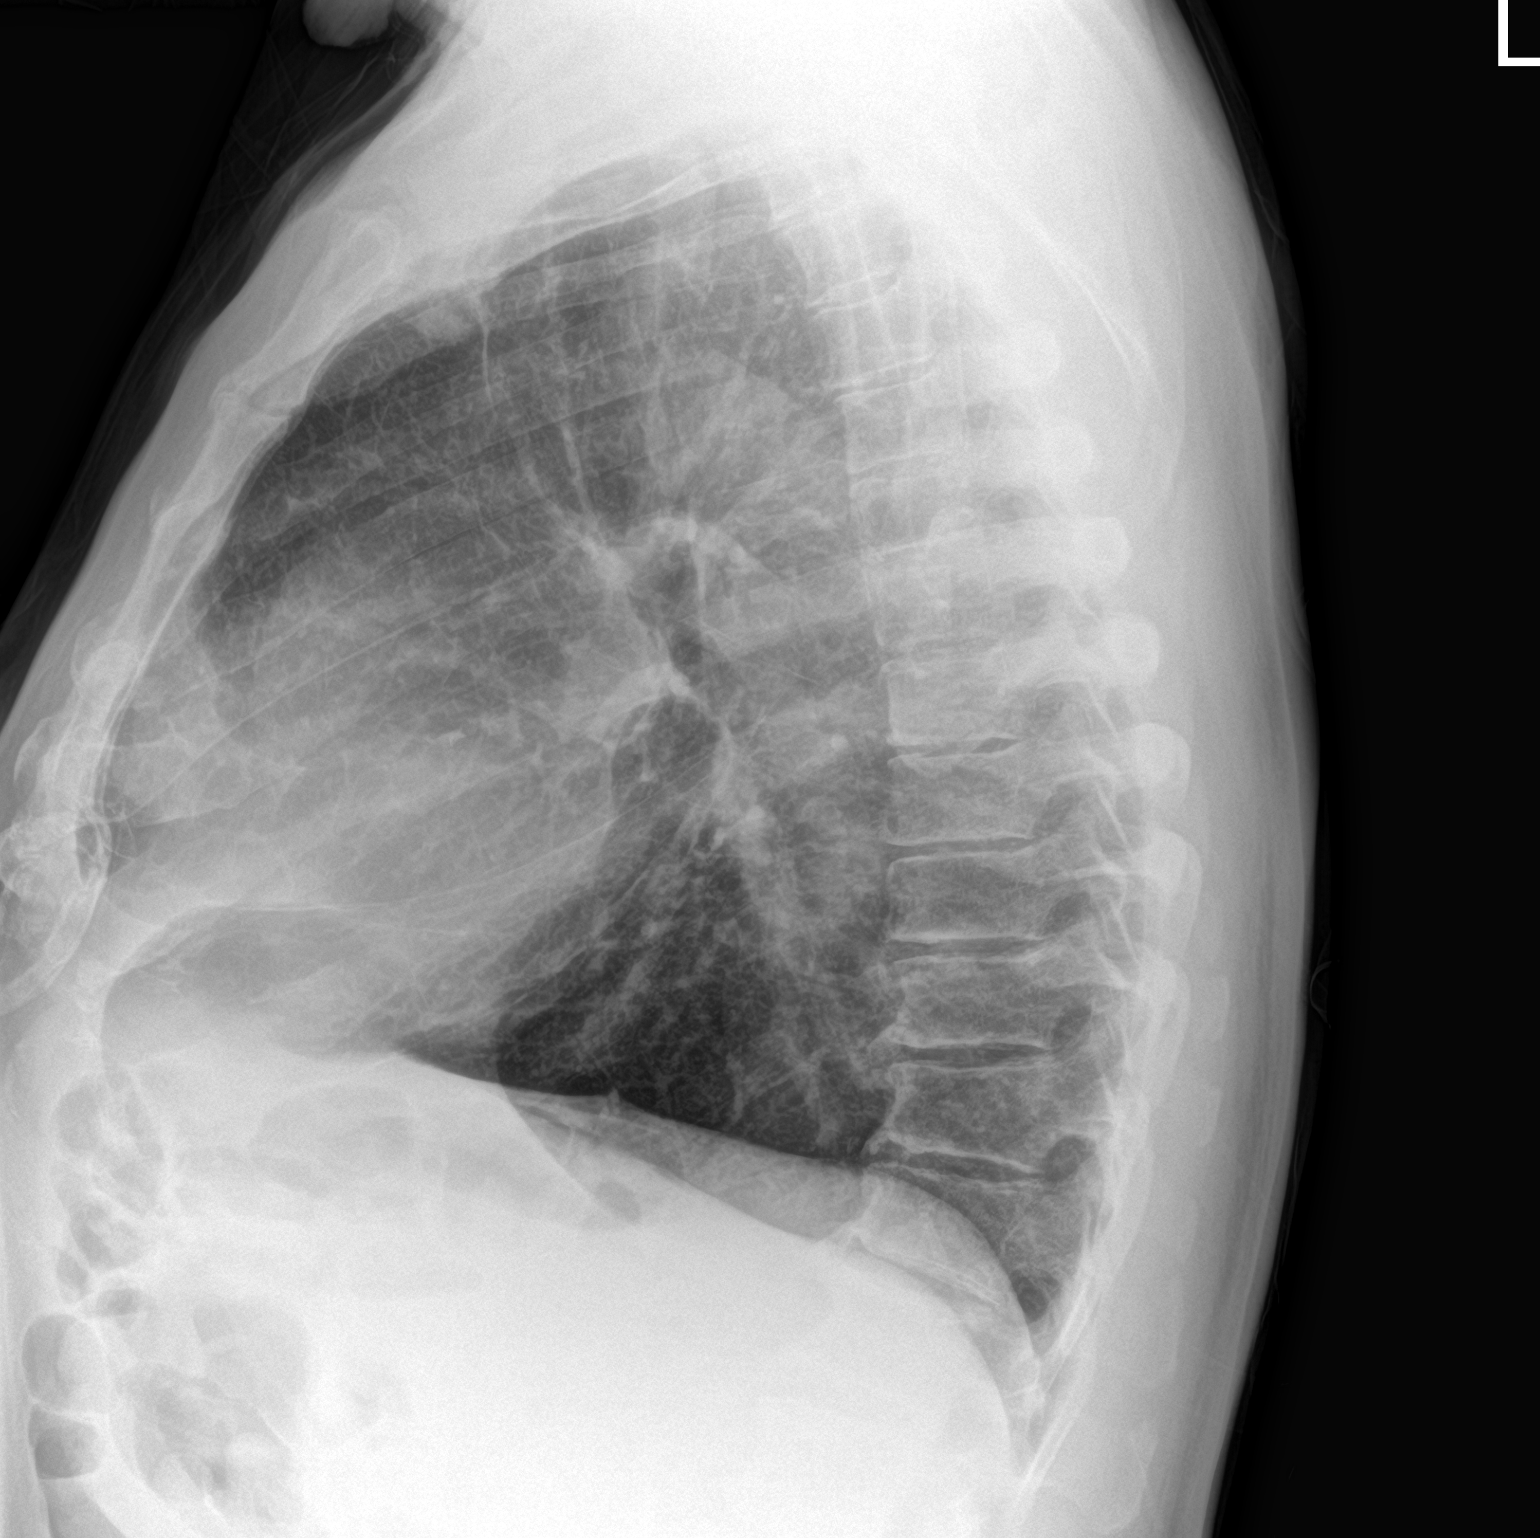

[2 of 2 positions shown; findings below may reference images not displayed]

FINDINGS: The heart is normal in size. Mild tortuosity of the thoracic aorta,
similar in appearance to prior exams allowing for differences in
rotation. The lungs are hyperinflated with peribronchial thickening.
Streaky right middle lobe atelectasis. No pulmonary edema, pleural
effusion, or pneumothorax. Remote right clavicle and rib fractures.
Thoracic spondylosis.
IMPRESSION: 1. Tortuosity of the ascending aorta, unchanged allowing for
differences in technique and projection.
2. Streaky right middle lobe atelectasis.
3. Chronic hyperinflation and bronchial thickening, suggestive of
COPD
